# Patient Record
Sex: Female | Born: 1935 | Race: White | Hispanic: No | Smoking: Former smoker
Health system: Southern US, Community
[De-identification: ages and names within clinical notes are randomized; demographics above are authoritative.]

## PROBLEM LIST (undated history)

## (undated) DIAGNOSIS — I4891 Unspecified atrial fibrillation: Secondary | ICD-10-CM

## (undated) DIAGNOSIS — I10 Essential (primary) hypertension: Secondary | ICD-10-CM

## (undated) DIAGNOSIS — R42 Dizziness and giddiness: Secondary | ICD-10-CM

## (undated) DIAGNOSIS — J449 Chronic obstructive pulmonary disease, unspecified: Secondary | ICD-10-CM

## (undated) DIAGNOSIS — I5032 Chronic diastolic (congestive) heart failure: Secondary | ICD-10-CM

## (undated) DIAGNOSIS — I639 Cerebral infarction, unspecified: Secondary | ICD-10-CM

## (undated) DIAGNOSIS — C50912 Malignant neoplasm of unspecified site of left female breast: Secondary | ICD-10-CM

## (undated) DIAGNOSIS — I272 Pulmonary hypertension, unspecified: Secondary | ICD-10-CM

## (undated) DIAGNOSIS — H5440 Blindness, one eye, unspecified eye: Secondary | ICD-10-CM

## (undated) DIAGNOSIS — I6529 Occlusion and stenosis of unspecified carotid artery: Secondary | ICD-10-CM

## (undated) DIAGNOSIS — S81802A Unspecified open wound, left lower leg, initial encounter: Secondary | ICD-10-CM

## (undated) HISTORY — DX: Malignant neoplasm of unspecified site of left female breast: C50.912

## (undated) HISTORY — DX: Dizziness and giddiness: R42

## (undated) HISTORY — DX: Unspecified open wound, left lower leg, initial encounter: S81.802A

## (undated) HISTORY — DX: Occlusion and stenosis of unspecified carotid artery: I65.29

## (undated) HISTORY — DX: Unspecified atrial fibrillation: I48.91

## (undated) HISTORY — DX: Essential (primary) hypertension: I10

## (undated) HISTORY — PX: APPENDECTOMY: SHX54

## (undated) HISTORY — PX: OTHER SURGICAL HISTORY: SHX169

## (undated) HISTORY — PX: ABDOMINAL HYSTERECTOMY: SHX81

## (undated) HISTORY — DX: Blindness, one eye, unspecified eye: H54.40

## (undated) HISTORY — DX: Chronic diastolic (congestive) heart failure: I50.32

## (undated) HISTORY — DX: Pulmonary hypertension, unspecified: I27.20

## (undated) HISTORY — DX: Cerebral infarction, unspecified: I63.9

## (undated) HISTORY — DX: Chronic obstructive pulmonary disease, unspecified: J44.9

---

## 2011-03-23 DIAGNOSIS — I639 Cerebral infarction, unspecified: Secondary | ICD-10-CM

## 2011-03-23 DIAGNOSIS — I6529 Occlusion and stenosis of unspecified carotid artery: Secondary | ICD-10-CM

## 2011-03-23 HISTORY — DX: Cerebral infarction, unspecified: I63.9

## 2011-03-23 HISTORY — DX: Occlusion and stenosis of unspecified carotid artery: I65.29

## 2011-03-23 HISTORY — PX: CAROTID ENDARTERECTOMY: SUR193

## 2011-04-27 ENCOUNTER — Inpatient Hospital Stay (HOSPITAL_COMMUNITY)
Admission: AD | Admit: 2011-04-27 | Discharge: 2011-05-04 | DRG: 038 | Disposition: A | Discharge status: Home Health | Payer: Medicare Other | Source: Other Acute Inpatient Hospital | Attending: Internal Medicine | Admitting: Internal Medicine

## 2011-04-27 DIAGNOSIS — I10 Essential (primary) hypertension: Secondary | ICD-10-CM | POA: Diagnosis present

## 2011-04-27 DIAGNOSIS — D649 Anemia, unspecified: Secondary | ICD-10-CM | POA: Diagnosis present

## 2011-04-27 DIAGNOSIS — Z853 Personal history of malignant neoplasm of breast: Secondary | ICD-10-CM

## 2011-04-27 DIAGNOSIS — Z7982 Long term (current) use of aspirin: Secondary | ICD-10-CM

## 2011-04-27 DIAGNOSIS — I63239 Cerebral infarction due to unspecified occlusion or stenosis of unspecified carotid arteries: Principal | ICD-10-CM | POA: Diagnosis present

## 2011-04-27 DIAGNOSIS — E785 Hyperlipidemia, unspecified: Secondary | ICD-10-CM | POA: Diagnosis present

## 2011-04-27 DIAGNOSIS — I4891 Unspecified atrial fibrillation: Secondary | ICD-10-CM | POA: Diagnosis present

## 2011-04-27 DIAGNOSIS — B372 Candidiasis of skin and nail: Secondary | ICD-10-CM | POA: Diagnosis not present

## 2011-04-27 DIAGNOSIS — Z23 Encounter for immunization: Secondary | ICD-10-CM

## 2011-04-27 DIAGNOSIS — L02419 Cutaneous abscess of limb, unspecified: Secondary | ICD-10-CM | POA: Diagnosis present

## 2011-04-27 DIAGNOSIS — Z9089 Acquired absence of other organs: Secondary | ICD-10-CM

## 2011-04-27 DIAGNOSIS — Z7901 Long term (current) use of anticoagulants: Secondary | ICD-10-CM

## 2011-04-27 DIAGNOSIS — H5440 Blindness, one eye, unspecified eye: Secondary | ICD-10-CM | POA: Diagnosis present

## 2011-04-28 ENCOUNTER — Other Ambulatory Visit (HOSPITAL_COMMUNITY): Payer: Medicare Other

## 2011-04-28 ENCOUNTER — Inpatient Hospital Stay (HOSPITAL_COMMUNITY): Payer: Medicare Other

## 2011-04-28 LAB — BASIC METABOLIC PANEL
BUN: 23 mg/dL (ref 6–23)
CO2: 30 mEq/L (ref 19–32)
Chloride: 99 mEq/L (ref 96–112)
GFR calc Af Amer: >60 mL/min (ref >60)
Glucose, Bld: 135 mg/dL — ABNORMAL HIGH (ref 70–99)
Potassium: 3.9 mEq/L (ref 3.5–5.1)
Potassium: 3.7 mEq/L (ref 3.5–5.1)
Sodium: 137 mEq/L (ref 135–145)
Sodium: 138 mEq/L (ref 135–145)

## 2011-04-28 LAB — DIFFERENTIAL
Basophils Absolute: 0 10*3/uL (ref 0.0–0.1)
Eosinophils Absolute: 0.6 10*3/uL (ref 0.0–0.7)
Lymphocytes Relative: 19 % (ref 12–46)
Lymphocytes Relative: 20 % (ref 12–46)
Lymphs Abs: 1.6 10*3/uL (ref 0.7–4.0)
Neutro Abs: 5.4 10*3/uL (ref 1.7–7.7)
Neutrophils Relative %: 65 % (ref 43–77)
Neutrophils Relative %: 63 % (ref 43–77)

## 2011-04-28 LAB — CBC
HCT: 38.7 % (ref 36.0–46.0)
Hemoglobin: 12.6 g/dL (ref 12.0–15.0)
MCV: 87.5 fL (ref 78.0–100.0)
Platelets: 175 10*3/uL (ref 150–400)
RBC: 4.4 MIL/uL (ref 3.87–5.11)
RBC: 4.43 MIL/uL (ref 3.87–5.11)
WBC: 8.1 10*3/uL (ref 4.0–10.5)
WBC: 8.6 10*3/uL (ref 4.0–10.5)

## 2011-04-28 LAB — HEPARIN LEVEL (UNFRACTIONATED): Heparin Unfractionated: 0.97 IU/mL — ABNORMAL HIGH (ref 0.30–0.70)

## 2011-04-28 LAB — GLUCOSE, CAPILLARY: Glucose-Capillary: 162 mg/dL — ABNORMAL HIGH (ref 70–99)

## 2011-04-28 MED ORDER — GADOBENATE DIMEGLUMINE 529 MG/ML IV SOLN: 20.0000 mL | Freq: Once | INTRAVENOUS | Status: AC | PRN

## 2011-04-29 ENCOUNTER — Inpatient Hospital Stay (HOSPITAL_COMMUNITY): Payer: Medicare Other

## 2011-04-29 LAB — HEPARIN LEVEL (UNFRACTIONATED)
Heparin Unfractionated: 0.13 IU/mL — ABNORMAL LOW (ref 0.30–0.70)
Heparin Unfractionated: 0.17 IU/mL — ABNORMAL LOW (ref 0.30–0.70)

## 2011-04-29 LAB — CBC
HCT: 37.4 % (ref 36.0–46.0)
Hemoglobin: 12.2 g/dL (ref 12.0–15.0)
RDW: 13.9 % (ref 11.5–15.5)
WBC: 9 10*3/uL (ref 4.0–10.5)

## 2011-04-30 ENCOUNTER — Inpatient Hospital Stay (HOSPITAL_COMMUNITY): Payer: Medicare Other

## 2011-04-30 DIAGNOSIS — I6529 Occlusion and stenosis of unspecified carotid artery: Secondary | ICD-10-CM

## 2011-04-30 LAB — CBC
HCT: 37.2 % (ref 36.0–46.0)
Hemoglobin: 11.9 g/dL — ABNORMAL LOW (ref 12.0–15.0)
MCHC: 32 g/dL (ref 30.0–36.0)
RBC: 4.22 MIL/uL (ref 3.87–5.11)

## 2011-04-30 LAB — DIFFERENTIAL
Basophils Absolute: 0 10*3/uL (ref 0.0–0.1)
Basophils Relative: 0 % (ref 0–1)
Lymphocytes Relative: 24 % (ref 12–46)
Monocytes Absolute: 0.7 10*3/uL (ref 0.1–1.0)
Monocytes Relative: 9 % (ref 3–12)
Neutro Abs: 4.1 10*3/uL (ref 1.7–7.7)
Neutrophils Relative %: 58 % (ref 43–77)

## 2011-04-30 LAB — HEPARIN LEVEL (UNFRACTIONATED): Heparin Unfractionated: 0.4 IU/mL (ref 0.30–0.70)

## 2011-04-30 LAB — LIPID PANEL
Cholesterol: 155 mg/dL (ref 0–200)
LDL Cholesterol: 76 mg/dL (ref 0–99)
Total CHOL/HDL Ratio: 3.3 RATIO
Triglycerides: 162 mg/dL — ABNORMAL HIGH (ref <150)
VLDL: 32 mg/dL (ref 0–40)

## 2011-04-30 LAB — HEMOGLOBIN A1C: Mean Plasma Glucose: 117 mg/dL — ABNORMAL HIGH (ref <117)

## 2011-05-01 ENCOUNTER — Other Ambulatory Visit: Payer: Self-pay | Admitting: Vascular Surgery

## 2011-05-01 DIAGNOSIS — I6529 Occlusion and stenosis of unspecified carotid artery: Secondary | ICD-10-CM

## 2011-05-01 DIAGNOSIS — G8190 Hemiplegia, unspecified affecting unspecified side: Secondary | ICD-10-CM

## 2011-05-01 LAB — BASIC METABOLIC PANEL
CO2: 28 mEq/L (ref 19–32)
Calcium: 9.1 mg/dL (ref 8.4–10.5)
GFR calc non Af Amer: 51 mL/min — ABNORMAL LOW (ref >60)
Glucose, Bld: 102 mg/dL — ABNORMAL HIGH (ref 70–99)
Potassium: 4 mEq/L (ref 3.5–5.1)
Sodium: 139 mEq/L (ref 135–145)

## 2011-05-01 LAB — TYPE AND SCREEN
ABO/RH(D): O POS
Antibody Screen: NEGATIVE

## 2011-05-01 LAB — DIFFERENTIAL
Basophils Absolute: 0 10*3/uL (ref 0.0–0.1)
Basophils Relative: 0 % (ref 0–1)
Eosinophils Absolute: 0.5 10*3/uL (ref 0.0–0.7)
Monocytes Relative: 10 % (ref 3–12)
Neutro Abs: 4.6 10*3/uL (ref 1.7–7.7)
Neutrophils Relative %: 64 % (ref 43–77)

## 2011-05-01 LAB — CBC
HCT: 34.4 % — ABNORMAL LOW (ref 36.0–46.0)
Hemoglobin: 11.6 g/dL — ABNORMAL LOW (ref 12.0–15.0)
MCH: 27.8 pg (ref 26.0–34.0)
MCHC: 32.6 g/dL (ref 30.0–36.0)
MCV: 86.6 fL (ref 78.0–100.0)
Platelets: 196 10*3/uL (ref 150–400)
Platelets: 175 10*3/uL (ref 150–400)
RBC: 4.17 MIL/uL (ref 3.87–5.11)
RDW: 14.1 % (ref 11.5–15.5)

## 2011-05-01 LAB — HEPARIN LEVEL (UNFRACTIONATED): Heparin Unfractionated: 0.41 IU/mL (ref 0.30–0.70)

## 2011-05-01 LAB — ABO/RH: ABO/RH(D): O POS

## 2011-05-02 LAB — DIFFERENTIAL
Basophils Absolute: 0 10*3/uL (ref 0.0–0.1)
Basophils Relative: 0 % (ref 0–1)
Eosinophils Absolute: 0.1 10*3/uL (ref 0.0–0.7)
Monocytes Absolute: 0.7 10*3/uL (ref 0.1–1.0)
Monocytes Relative: 9 % (ref 3–12)

## 2011-05-02 LAB — BASIC METABOLIC PANEL
Calcium: 8.9 mg/dL (ref 8.4–10.5)
GFR calc Af Amer: >60 mL/min (ref >60)
GFR calc non Af Amer: 57 mL/min — ABNORMAL LOW (ref >60)
Sodium: 137 mEq/L (ref 135–145)

## 2011-05-02 LAB — CBC
MCH: 27.9 pg (ref 26.0–34.0)
MCHC: 31.9 g/dL (ref 30.0–36.0)
Platelets: 157 10*3/uL (ref 150–400)
RDW: 14.3 % (ref 11.5–15.5)

## 2011-05-03 LAB — CBC
MCH: 28.1 pg (ref 26.0–34.0)
Platelets: 156 10*3/uL (ref 150–400)
RBC: 3.99 MIL/uL (ref 3.87–5.11)
WBC: 8.6 10*3/uL (ref 4.0–10.5)

## 2011-05-03 LAB — PROTIME-INR
INR: 1.02 (ref 0.00–1.49)
Prothrombin Time: 13.6 seconds (ref 11.6–15.2)

## 2011-05-04 ENCOUNTER — Telehealth: Payer: Self-pay | Admitting: *Deleted

## 2011-05-04 ENCOUNTER — Telehealth: Payer: Self-pay | Admitting: Cardiovascular Disease

## 2011-05-04 LAB — CBC
MCH: 27.9 pg (ref 26.0–34.0)
MCV: 87.9 fL (ref 78.0–100.0)
Platelets: 163 10*3/uL (ref 150–400)
RDW: 14 % (ref 11.5–15.5)
WBC: 8.2 10*3/uL (ref 4.0–10.5)

## 2011-05-04 LAB — DIFFERENTIAL
Basophils Relative: 0 % (ref 0–1)
Eosinophils Absolute: 0.5 10*3/uL (ref 0.0–0.7)
Eosinophils Relative: 7 % — ABNORMAL HIGH (ref 0–5)
Lymphs Abs: 1.7 10*3/uL (ref 0.7–4.0)

## 2011-05-04 LAB — PROTIME-INR
INR: 1.06 (ref 0.00–1.49)
Prothrombin Time: 14 seconds (ref 11.6–15.2)

## 2011-05-04 NOTE — Telephone Encounter (Signed)
Per reply dr arida--pt does not need to take amlodipine for now--will dicuss at first post hosp o.v.--this information given to son--nt

## 2011-05-04 NOTE — Telephone Encounter (Signed)
No need to take Amlodipine for now. Will decide if we need to resume it when she comes for follow up.

## 2011-05-04 NOTE — Telephone Encounter (Signed)
Pt's son calling stating dr fields(vascular ) had his mother in hosp for endarterectomy and when d/c dr fields did not list amlodipine on med sheet for post op med--pt has been taking this, according to son, for long time--could not find any notes about discharge in e-chart--advised pt to phone dr fields--pt states mother has appoint in our [place] office, but not for 2 weeks, and i don't see any notes from [place] in EPIC--will notify dr Kirke Corin for some help--nt

## 2011-05-04 NOTE — Telephone Encounter (Signed)
Per pt son call, pt just got out of hospital and was given a list of medications pt can and cannot take. One of the medications pt takes was not on the list, pt would like to know is she can take this medication  Amilodipine 10 mg  Please call back to advise.

## 2011-05-06 NOTE — Discharge Summary (Signed)
NAMEMarland Kitchen  Karina, Salazar NO.:  [id number]  MEDICAL RECORD NO.:  [id number]  LOCATION:  2039                         FACILITY:  [facility]  PHYSICIAN:  Lonia Blood, M.D.       DATE OF BIRTH:  12-24-1923  DATE OF ADMISSION:  04/27/2011 DATE OF DISCHARGE:  05/04/2011                              DISCHARGE SUMMARY   PRIMARY CARE PHYSICIAN:  Dr. Loney Hering in [place], [place].  DISCHARGE DIAGNOSES: 1. Symptomatic right internal carotid artery stenosis with a right     frontal corona radiata infarct. 2. New diagnosis of atrial fibrillation - felt to be asymptomatic. 3. Chronic occlusion of the left internal carotid artery. 4. Hypertension. 5. Hyperlipidemia. 6. Status post splenectomy. 7. Status post hysterectomy.  DISCHARGE MEDICATIONS: 1. Aspirin 81 mg daily. 2. Atenolol 50 mg daily. 3. Calcium a tablet daily. 4. Coumadin 2 mg daily. 5. Lisinopril/hydrochlorothiazide 20/12.5 daily. 6. Multivitamin daily. 7. Sulfadiazine applied to the right neck twice a day.  CONDITION ON DISCHARGE:  Karina Salazar was discharged in good condition, alert, oriented, no acute distress.  Neurologically intact.  She will follow up at the [facility] on May 08, 2011 for PT/INR check.  She will also follow up with her primary care physician as needed and with Dr. Fabienne Bruns from vascular vein specialist for the post carotid endarterectomy followup.  CONSULTATION THIS ADMISSION:  The patient was seen by the [facility] Cardiology Service, Neurology Service and Dr. Fabienne Bruns from Vascular Surgery.  HISTORY AND PHYSICAL:  Refer to dictated H and P done by Dr. Jomarie Longs.  HOSPITAL COURSE:  Karina Salazar is an 87-year woman with multiple medical problems who was transferred from [facility] with an acute stroke.  Initially, there was a debate between amongst the medical team, but her stroke was due to atrial fibrillation versus the right internal carotid stenosis.  After much  deliberation, Dr. Delia Heady, a stroke specialist, Director of the Stroke Center, felt that the stroke was due to symptomatic right ICA stenosis.  The patient at that point in time was getting treated with intravenous heparin.  This was felt to be unnecessary after this designation and the patient was switched to antiplatelet therapy alone.  She then underwent right internal carotid endarterectomy with Dr. Fabienne Bruns on May 01, 2011. Postoperatively again the team of the cardiologist with  Group Dr. Riley Kill, the hospital is Dr. Lavera Guise and neurologist Dr. Pearlean Brownie, conferred and consensus was not to do any heparin bridging due to the high risk of forming a right neck hematoma in somebody with the fresh carotid endarterectomy.  The patient was started on Coumadin alone.  She was continued on aspirin and she will follow up at the [facility].  Post her stroke, the patient was seen by Physical Therapy, Occupational Therapy, and she is neurologically intact, able to return home with the family without any other therapy needs.  During this admission, Karina Salazar was observed on telemetry and it was noted that her atrial fibrillation was very well rate controlled.  The patient also having immunoglobin, it was measured 5.7 which indicated that she does not have diabetes mellitus type  2.  Otherwise, the patient's lipid profile indicated an LDL of 76 which is well below the goal of 100. Otherwise Karina Salazar was found to be hemodynamically stable, neurologically intact and on May 04 2011, she was discharged home to discharge summary.     Lonia Blood, M.D.     SL/MEDQ  D:  05/05/2011  T:  05/05/2011  Job:  [id number]  Electronically Signed by Lonia Blood M.D. on 05/06/2011 07:58:28 AM

## 2011-05-07 NOTE — Consult Note (Signed)
NAMEMarland Kitchen  Karina Salazar, Karina Salazar NO.:  [id number]  MEDICAL RECORD NO.:  [id number]  LOCATION:  3715                         FACILITY:  [facility]  PHYSICIAN:  Janetta Hora. Fields, MD  DATE OF BIRTH:  1924/05/17  DATE OF CONSULTATION:  04/30/2011 DATE OF DISCHARGE:                                CONSULTATION   REFERRING PHYSICIAN:  Pramod P. Pearlean Brownie, MD  REASON FOR CONSULTATION:  Symptomatic right internal carotid artery stenosis.  HISTORY OF PRESENT ILLNESS:  The patient is an 77 year old female transferred from [facility] after an episode of left-handed weakness, numbness, and slurring of speech.  This occurred on April 25, 2011.  Evaluation with a carotid duplex scan and CT scan of the head as well as an MRA of the neck showed a left internal carotid artery occlusion with a high-grade right internal carotid artery stenosis and she has subacute infarct of her right frontoparietal area of her brain.  The patient has essentially returned to baseline neurologic function at this point.  She was transferred to [facility] for further management. She is currently on a heparin drip.  She has had no new neurologic events.  CHRONIC MEDICAL PROBLEMS:  Hypertension and a left leg chronic ulcer, these problems are currently stable, atrial fibrillation as well.  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  FAMILY HISTORY:  No significant history of early vascular disease.  SOCIAL HISTORY:  The patient lives at home alone and has family to check in on her frequently.  She denies tobacco, alcohol, or illicit drug use.  REVIEW OF SYSTEMS:  Full 12-point review of systems was performed with the patient and reviewed from previous history and physical on March 28, 2011, as well as neurologic consultation on March 28, 2011, and this was all negative on all  points.  MEDICATIONS: 1. Aspirin 81 mg once a day. 2. Atenolol 50 mg once a day. 3. Diflucan 100 mg once a day. 4. Heparin drip. 5. Lisinopril 10 mg once a day. 6. Silvadene to left calf wound b.i.d. 7. Bactrim 1 tablet twice a day. 8. Tylenol and Zofran p.r.n.  ALLERGIES:  She is allergic to CIPRO.  PHYSICAL EXAMINATION:  VITAL SIGNS:  Temperature 99.4, pulse 101 and irregular, respirations 20, blood pressure 113/71, 96% saturation on room air. HEENT:  Unremarkable except for right eye blindness and the pupil was nonreactive. NECK:  2+ carotid pulses.  No bruit. CHEST:  Clear to auscultation. CARDIAC:  Irregularly irregular.  No murmur. ABDOMEN:  Obese, soft, nontender, nondistended.  No mass. EXTREMITIES:  She has no palpable pedal pulses bilaterally.  She has a 3 cm less than a mm in depth ulceration on the left pretibial area.  Feet are pink, warm, and well perfused.  MRA of the head was reviewed as well as MRA of the neck.  This again confirms occlusion of the left internal carotid artery with some intracranial disease.  She has a type 3 aortic arch.  She has a high- grade greater than 80% right internal carotid artery stenosis with reconstitution distally.  MRI of  the head shows subacute right frontal corona radiata infarct as well as watershed infarcts in the anterior cerebral and middle cerebral territories.  She also has remote bilateral basal ganglia lacunes.  LABORATORY:  Hemoglobin A1c 5.7, triglyceride 162, cholesterol 155. White blood cell count 7.1, hemoglobin 11.9, platelets 193, BUN 23, creatinine 0.9.  ASSESSMENT:  An 77 year old female with recent right brain infarct and symptomatic right internal carotid artery stenosis.  The patient has reached a neurologic plateau and is back to her baseline.  I had a lengthy discussion with the patient and her family today regarding options of medical therapy versus carotid stenting versus carotid endarterectomy.   Currently, the risk of carotid stenting in a patient greater than 45 years old is fairly significant and certainly more than carotid endarterectomy.  Especially in light of the fact that the patient has a type 3 aortic arch, it would be anatomically difficult to perform a carotid stent.  I quoted her risk of carotid stenting of stroke between 5 and 10%.  Risk of stroke with carotid endarterectomy 1- 2%.  Risk of stroke with medical therapy between 10 and 20%.  Other risk, benefits, and possible complications of carotid endarterectomy were also discussed with the patient and her family today.  At this point, she is agreeable for right carotid endarterectomy.  Her procedure was scheduled for tomorrow.  She will be n.p.o. after midnight tonight. We will stop her heparin at 5 a.m.  She will have her aspirin with a small sip of water in the morning.     Janetta Hora. Fields, MD     CEF/MEDQ  D:  04/30/2011  T:  05/01/2011  Job:  [id number]  cc:   Clyda Greener, MD Pramod P. Pearlean Brownie, MD  Electronically Signed by Fabienne Bruns MD on 05/07/2011 09:54:06 AM

## 2011-05-07 NOTE — Op Note (Signed)
NAMEMarland Salazar  ELESE, RANE NO.:  [id number]  MEDICAL RECORD NO.:  [id number]  LOCATION:  2039                         FACILITY:  [facility]  PHYSICIAN:  Janetta Hora. Fields, MD  DATE OF BIRTH:  02/20/1924  DATE OF PROCEDURE:  05/01/2011 DATE OF DISCHARGE:                              OPERATIVE REPORT   PROCEDURE:  Right carotid endarterectomy.  PREOPERATIVE DIAGNOSIS:  Symptomatic right internal carotid artery stenosis.  POSTOPERATIVE DIAGNOSIS:  Symptomatic right internal carotid artery stenosis.  ANESTHESIA:  General.  SURGEON:  Charles E. Fields, MD  ASSISTANTS: 1. Di Kindle. Edilia Bo, M.D. 2. Newton Pigg, PA-C  OPERATIVE FINDINGS: 1. Greater than 80% stenosis, right internal carotid artery. 2. 10-French shunt. 3. Dacron patch.  OPERATIVE DETAILS:  After obtaining informed consent, the patient was taken to the operating room.  The patient was placed in supine position on the operating table.  After induction of general anesthesia and endotracheal intubation, the patient's entire right neck and chest were prepped and draped in the usual sterile fashion.  Next, an oblique incision was made on the right side of the neck just anterior to the border of the right sternocleidomastoid muscle.  Incision was carried down through the subcutaneous tissues and through the platysma muscle. The sternocleidomastoid muscle was identified and reflected laterally. The common facial vein was identified, dissected free circumferentially, and ligated and divided between silk ties.  The ansa cervicalis was identified and this was traced up to its origin of the hypoglossal nerve.  The hypoglossal nerve was well up out of the operative field and protected at all times during the operation.  Common carotid artery was dissected free circumferentially at the base of the neck.  To allow further exposure, the omohyoid muscle was completely divided with cautery.  The patient  had a fairly short neck and I was able to expose a moderate amount of common carotid artery to facilitate proximal clamping site.  Common carotid artery was soft in character.  The vagus nerve was identified and protected.  Dissection was carried up to the level of the superior thyroid artery and this was dissected free circumferentially and vessel loop was placed around this.  There was firm plaque at the carotid bifurcation.  The external carotid artery was dissected free circumferentially and vessel loop was placed around this.  The distal internal carotid artery was then dissected free circumferentially at a place of that was palpable and seemed to be free of disease distally on the internal carotid artery.  The patient was given 10,000 units of intravenous heparin.  The distal internal carotid arteries were controlled with a fine bulldog clamp.  The external carotid and superior thyroid arteries were controlled with vessel loops.  Common carotid artery was controlled with peripheral DeBakey clamp.  Longitudinal arteriotomy was then made using #11 blade to expose the carotid bifurcation.  The arteriotomy was then extended distally into the internal carotid artery past the level of the disease.  There was firm, rubbery plaque with a recent hemorrhage within the plaque at the level of the stenosis.  The stenosis was approximately 80%.  A 10-French shunt was then brought up in the operative  field and threaded into the distal internal carotid artery and allowed to backbleed thoroughly.  There was reasonable backbleeding from this.  This was then threaded down the common carotid artery and secured with a Rumel tourniquet.  Shunt was inspected and found to be free of air, and flow was restored to the brain after approximately 5 minutes of ischemia time.  Next, endarterectomy was begun in a suitable plane near the carotid bifurcation.  A good distal endpoint was obtained.  There was  minimal disease in the common carotid artery and a nice feathered proximal endpoint was also obtained.  The external carotid artery was endarterectomized in an eversion technique.  All loose debris was removed from the carotid bed.  Dacron patch was then brought up in the operative field and sewn as a patch angioplasty using a running 6-0 Prolene suture.  Just prior to completion of anastomosis, the shunt was re-occluded with a hemostat.  Shunt was then brought down out of the distal internal carotid artery and allowed to backbleed thoroughly. This was then secured with a fine bulldog clamp.  Shunt was then removed from the proximal common carotid artery and this was again controlled with peripheral DeBakey clamp.  Everything was forebled, backbled, and thoroughly flushed.  Heparinized saline was used to thoroughly irrigate the artery.  The remainder of the patch was then completed.  Just prior to completion anastomosis, the external carotid artery was thoroughly backbled.  Flow was then first restored from common carotid and external carotid artery and after approximately 5 cardiac cycles to the internal carotid artery.  Doppler was used to evaluate the internal, external, and common carotid arteries and these all had good flow.  Hemostasis was obtained with the administration of protamine as well as thrombin and Gelfoam.  After hemostasis had been obtained, the platysma muscles were reapproximated using running 3-0 Vicryl suture.  Skin was closed with 4- 0 Vicryl subcuticular stitch.  Dermabond was applied on the incision. The patient tolerated the procedure well and there were no complications.  Instrument, sponge, and needle counts were correct at the end of the case.  The patient was awakened in the operating room and following commands and moving upper and lower extremities symmetrically on departure from the operating room.     Janetta Hora. Fields, MD     CEF/MEDQ  D:   05/02/2011  T:  05/03/2011  Job:  [id number]  cc:   Pramod P. Pearlean Brownie, MD Clyda Greener, MD  Electronically Signed by Fabienne Bruns MD on 05/07/2011 09:54:15 AM

## 2011-05-08 ENCOUNTER — Ambulatory Visit (INDEPENDENT_AMBULATORY_CARE_PROVIDER_SITE_OTHER): Payer: Medicare Other | Admitting: *Deleted

## 2011-05-08 DIAGNOSIS — Z7901 Long term (current) use of anticoagulants: Secondary | ICD-10-CM | POA: Insufficient documentation

## 2011-05-08 DIAGNOSIS — I4891 Unspecified atrial fibrillation: Secondary | ICD-10-CM | POA: Insufficient documentation

## 2011-05-08 DIAGNOSIS — I6350 Cerebral infarction due to unspecified occlusion or stenosis of unspecified cerebral artery: Secondary | ICD-10-CM | POA: Insufficient documentation

## 2011-05-09 NOTE — Consult Note (Signed)
NAMEMarland Salazar  DANAJA, LASOTA NO.:  [id number]  MEDICAL RECORD NO.:  [id number]  LOCATION:  3715                         FACILITY:  [facility]  PHYSICIAN:  Joycelyn Schmid, MD   DATE OF BIRTH:  1924-08-22  DATE OF CONSULTATION:  04/27/2011 DATE OF DISCHARGE:                                CONSULTATION   TIME:  9:30 p.m.  CHIEF COMPLAINT:  Dizziness, TIA, right ICA stenosis, left ICA occlusion.  HISTORY OF PRESENT ILLNESS:  This 77 year old right-handed female with history of hypertension, breast cancer, now with new-onset atrial fibrillation, was in normal state of health until April 25, 2011, when she was at home, sitting on the sofa began to feel lightheaded and dizzy. She tried to stand up and fell backwards.  She landed back onto the couch and noticed that the left arm rotated internally.  There were some reports per the family that her speech was slurred.  At some point, her symptoms resolved although the patient is not sure when they stopped. Presently, the patient does not have the symptoms which she presented with.  She was admitted to [facility] for further evaluation. There, she was diagnosed with atrial fibrillation and started on heparin drip.  She was also diagnosed with left ICA occlusion and right ICA stenosis.  She was then transferred to [facility] for further evaluation.  PAST MEDICAL HISTORY: 1. Hypertension. 2. Left leg wound. 3. Left breast cancer. 4. Right eye blindness secondary to trauma.  PAST SURGICAL HISTORY: 1. Left mastectomy for breast cancer. 2. Bowel resection in the past. 3. Left cataract surgery. 4. Hysterectomy. 5. Appendectomy.  FAMILY HISTORY:  None.  SOCIAL HISTORY:  She lives at home, her nephew lives with her.  No tobacco, alcohol, or illicit drug use.  REVIEW OF SYSTEMS:  As per the HPI, she denies chest pain, shortness of breath, nausea or vomiting, blurred vision, slurred speech, numbness  or weakness at this time.  Full 14-system review is otherwise negative.  HOME MEDICATIONS: 1. Atenolol 50 mg daily. 2. Lisinopril/hydrochlorothiazide 20/12.5 tablets one daily. 3. Guaifenesin 600 mg t.i.d. 4. Aspirin 325 mg twice a day. 5. Calcium 1 tablet per day. 6. Multivitamin one tablet per day.  ALLERGIES:  CIPROFLOXACIN and FLUOROQUINOLONES.  PHYSICAL EXAMINATION:  VITAL SIGNS:  Blood pressure 189/51, temperature 98.3, heart rate 86 and regular, respirations 18, O2 sat 100% on 2 liters nasal cannula. GENERAL:  She is awake and alert.  Language is fluent.  Comprehension is intact.  She has some psychomotor slowing.  She did not know what the month is.  She states the year is 2012 and states her age is 77 years old. NEUROLOGIC:  Cranial Nerve:  Right eye posttraumatic and no reaction to light, no light perception.  Left eye, reactive from 2-1 mm, left visual fields in left eye are full to confrontation.  Extraocular muscles intact.  Facial sensation and strength are symmetric.  Uvula is midline. Shoulders are symmetric.  Tongue is midline.  Motor examination normal bulk and tone.  No drift in the upper or lower extremities, 5/5 strength in the bilateral upper extremities, 4/5 proximally in the lower extremities, 5/5 distally  in the lower extremities and symmetric. Sensory examination marked decreased pinprick and light touch sensation in the bilateral lower extremities.  She has absent vibration at the knees and ankles and toes. Reflexes:  Trace in the upper extremities and absent at the knees and ankles with downgoing toes. Coordination Testing:  Finger-nose-finger smooth and symmetric. CARDIOVASCULAR:  Regular rate and rhythm.  No murmurs.  No carotid bruits.  ASSESSMENT:  This is an 77 year old female with new-onset atrial fibrillation, now with suspected right brain transient ischemic attack in the setting of the left internal carotid artery occlusion and  right internal carotid artery near occlusive stenosis.  RECOMMENDATIONS: 1. Agree with anticoagulation with heparin drip. 2. MRI of the brain, MRA of the head and neck. 3. We would avoid hypotension, goal systolic blood pressure 120-180. 4. After additional neuroimaging studies are complete, we would     consider Vascular Surgery consult.  At this time, the patient is     not sure if she want to undergo surgery; however, I think we should     consider the possibility of carotid endarterectomy if she would     want to go through it and if she could tolerate the surgery from a     cardiac standpoint.  Otherwise, there may be a need to be a     consideration for carotid stent placement.  We will discuss with     Vascular Surgery for further recommendations.     Joycelyn Schmid, MD     VP/MEDQ  D:  04/27/2011  T:  04/27/2011  Job:  [id number]  Electronically Signed by Joycelyn Schmid  on 05/09/2011 10:23:01 PM

## 2011-05-15 ENCOUNTER — Ambulatory Visit (INDEPENDENT_AMBULATORY_CARE_PROVIDER_SITE_OTHER): Payer: Self-pay | Admitting: *Deleted

## 2011-05-15 DIAGNOSIS — I6350 Cerebral infarction due to unspecified occlusion or stenosis of unspecified cerebral artery: Secondary | ICD-10-CM

## 2011-05-15 DIAGNOSIS — Z7901 Long term (current) use of anticoagulants: Secondary | ICD-10-CM

## 2011-05-15 DIAGNOSIS — R0989 Other specified symptoms and signs involving the circulatory and respiratory systems: Secondary | ICD-10-CM

## 2011-05-15 DIAGNOSIS — I4891 Unspecified atrial fibrillation: Secondary | ICD-10-CM

## 2011-05-22 ENCOUNTER — Ambulatory Visit (INDEPENDENT_AMBULATORY_CARE_PROVIDER_SITE_OTHER): Payer: Self-pay | Admitting: *Deleted

## 2011-05-22 DIAGNOSIS — I6350 Cerebral infarction due to unspecified occlusion or stenosis of unspecified cerebral artery: Secondary | ICD-10-CM

## 2011-05-22 DIAGNOSIS — I4891 Unspecified atrial fibrillation: Secondary | ICD-10-CM

## 2011-05-22 DIAGNOSIS — Z7901 Long term (current) use of anticoagulants: Secondary | ICD-10-CM

## 2011-05-22 DIAGNOSIS — R0989 Other specified symptoms and signs involving the circulatory and respiratory systems: Secondary | ICD-10-CM

## 2011-05-22 LAB — POCT INR: INR: 1.9

## 2011-05-22 MED ORDER — WARFARIN SODIUM 2 MG PO TABS: ORAL_TABLET | ORAL | Status: DC

## 2011-05-24 ENCOUNTER — Ambulatory Visit: Payer: Medicare Other | Admitting: Vascular Surgery

## 2011-05-24 NOTE — H&P (Signed)
NAME:  Karina Salazar, Karina Salazar NO.:  [id number]  MEDICAL RECORD NO.:  [id number]  LOCATION:  3715                         FACILITY:  [facility]  PHYSICIAN:  Zannie Cove, MD     DATE OF BIRTH:  Mar 26, 1924  DATE OF ADMISSION:  04/27/2011 DATE OF DISCHARGE:                             HISTORY & PHYSICAL   CHIEF COMPLAINT:  Transfer from [facility] for further evaluation.  HISTORY OF PRESENTING ILLNESS:  Karina Salazar is an 77 year old white female with history of hypertension, breast cancer.  Presented to [facility] on April 25, 2011, with chief complaint of dizziness, left hand weakness and numbness and slurring of speech.  On evaluation over there, she was found to be in new onset AFib, subsequently seen by [facility] and as part of her workup, she had a CT scan done which did not show any acute abnormality, but CT report read remote left posterior frontal cortical infarct.  In addition, she also had a parotid Duplex done at [facility] yesterday which showed left ICA origin occlusion and high-grade new occlusive proximal right ICA stenosis and subsequently transferred to [facility] for further management, possible vascular evaluation.  PAST MEDICAL HISTORY:  Significant for, 1. Remote history of breast cancer status post left mastectomy. 2. History of resection of her bowel many years ago. 3. Left cataract surgery. 4. Hysterectomy. 5. Appendectomy. 6. Right leg wound sustained about 2-4 weeks ago while doing yard     work.  FAMILY HISTORY:  Due to advanced age, noncontributory.  HOME MEDICATIONS: 1. Atenolol 50 mg daily. 2. Lisinopril/hydrochlorothiazide 20/12.5 one tab daily. 3. Guaifenesin 600 mg t.i.d. 4. Aspirin 325 daily. 5. Calcium 1 tab daily. 6. Multivitamin 1 tablet daily.  ALLERGIES: 1. CIPROFLOXACIN. 2. FLUOROQUINOLONE.  SOCIAL HISTORY:  She lives at home by herself, is independent in ADLs. Ambulates with help of a cane.   Denies any history of alcohol or tobacco use.  REVIEW OF SYSTEMS:  Negative except per HPI.  PHYSICAL EXAMINATION:  VITAL SIGNS:  Currently temp is 97.9, pulse 60, blood pressure 142/65, respirations 18, and satting 100% on 2 L. GENERAL:  This is a elderly Caucasian female lying in the stretcher in no acute distress. HEENT:  Pupils are 4 mm reactive to light.  Extraocular movements intact.  Oral mucosa is moist. NECK:  No JVD or lymphadenopathy. CARDIOVASCULAR:  S1, S2 irregularly irregular rhythm. LUNGS:  Clear to auscultation bilaterally. CHEST:  On the left chest, there is a large area of erythema likely related to irritation. ABDOMEN:  Soft, obese, nontender with normal bowel sounds. EXTREMITIES:  Her right lower extremity has erythema, 1+ edema, multiple small skin tears, and superficial ulcers with overlying erythema.  LABORATORY DATA:  Review of laboratory data from [facility] on April 25, 2011, shows a white count of 7.2, hemoglobin 11.7, and platelets 163.  BMET; sodium of 140, potassium 4.2, chloride 102, bicarb 29, BUN 34, creatinine 1.4.  PT is 13, INR 1.0, CK is 45, CK-MB is 1.9, relative index 4.2, and troponin I is 0.02.  EKG again from [facility] head shows atrial fibrillation without acute ST-T wave changes.  Report of diagnostic studies from  [facility] include 2-D echocardiogram showed LV cavity size normal, mild concentric ventricular hypertrophy, ejection fraction is 65%, septal flattening, left atrium moderately dilated, and RV mildly dilated.  Mild to moderate tricuspid regurg, moderate pulmonary hypertension.  No pericardial effusion. Carotid duplex also done in [facility] report shows left internal carotid artery origin occlusion, high-grade knee occlusive proximal right ICA stenosis, antegrade flow in both vertebral arteries.  ASSESSMENT/PLAN:  Karina Salazar is an 77 year old female with, 1. New onset atrial fibrillation, rate is controlled.   Keep her on     telemetry.  Continue atenolol at home dose.  In terms of     anticoagulating her, I would recommend continuing heparin with     possibly starting Coumadin for long time anticoagulation.  Her [place]     score is significantly high given that her CT scan does show     evidence of previous stroke, however this is not without risk due     to the fact that she has issues with falls as well as decreased     vision in one of her eyes.  However, we will consult Neurology to     assist with further evaluation and management for her ICA internal     carotid artery disease.  We will also check an MRI and MRA of her     brain and neck. 2. We will notify [facility] about the patient's transfer to     [facility] today.  In terms of vascular surgical evaluation, we     will await neurological opinion regarding this.  I doubt she would     be a candidate for carotid endarterectomy, however we will defer     this expert opinion to the neurologist.  Further management as     condition evolves.     Zannie Cove, MD     PJ/MEDQ  D:  04/27/2011  T:  04/27/2011  Job:  [id number]  cc:   Karina Greener, MD  Electronically Signed by Zannie Cove  on 05/24/2011 05:10:25 PM

## 2011-05-25 ENCOUNTER — Ambulatory Visit (INDEPENDENT_AMBULATORY_CARE_PROVIDER_SITE_OTHER): Payer: Medicare Other | Admitting: Cardiovascular Disease

## 2011-05-25 ENCOUNTER — Encounter: Payer: Self-pay | Admitting: *Deleted

## 2011-05-25 VITALS — BP 144/83 | HR 90 | Ht 60.0 in | Wt 193.0 lb

## 2011-05-25 DIAGNOSIS — I4891 Unspecified atrial fibrillation: Secondary | ICD-10-CM

## 2011-05-25 DIAGNOSIS — I10 Essential (primary) hypertension: Secondary | ICD-10-CM

## 2011-05-25 DIAGNOSIS — I6529 Occlusion and stenosis of unspecified carotid artery: Secondary | ICD-10-CM

## 2011-05-25 NOTE — Progress Notes (Signed)
HPI  This is an 77 year old female who is here today for a followup visit. I saw her in June at [facility]. She presented at that time with a stroke and was found to have atrial fibrillation which was new. He her carotid ultrasound Doppler showed an occluded left ICA and near occlusive disease in the right ICA. The patient was transferred to [facility] where she was evaluated by neurology and vascular surgery. She ultimately underwent right carotid endarterectomy which was done by Dr. Darrick Penna. She was followed by our cardiology team there. Her atrial fibrillation rate was controlled with atenolol. She was started on anticoagulation with warfarin after the surgery. She has been doing reasonably well. She is currently getting physical therapy. She denies any chest pain or dyspnea. There has been no palpitations, dizziness or syncope.  Allergies  Allergen Reactions  . Ciprofloxacin   . Other     FLUOROQUINOLONE     Current Outpatient Prescriptions on File Prior to Visit  Medication Sig Dispense Refill  . warfarin (COUMADIN) 2 MG tablet Take 2 tablets daily or as directed  60 tablet  3     Past Medical History  Diagnosis Date  . Breast cancer, left breast     s/p left mastectomy  . Leg wound, left   . Blindness of right eye     sustained trauma to right eye  . Dizziness     occasional  . Atrial fibrillation   . Hypertension   . Carotid artery occlusion 03/2011    occluded left ICA. Near occlusive disease in R ICA. s/p CEA  . Stroke 03/2011     Past Surgical History  Procedure Date  . Left breast mastectomy     breast cancer  . Resection of bowel     many years ago,small bowel obstruction  . Abdominal hysterectomy   . Appendectomy   . Left eye cataract surgery   . Carotid endarterectomy 03/2011    right     Family History  Problem Relation Age of Onset  . Other      Negative for premature coronary artery disease or arrhythmia     History   Social  History  . Marital Status: Divorced    Spouse Name: N/A    Number of Children: N/A  . Years of Education: N/A   Occupational History  . Not on file.   Social History Main Topics  . Smoking status: Former Smoker -- 0.5 packs/day for 35 years    Types: Cigarettes    Quit date: 10/22/1976  . Smokeless tobacco: Never Used  . Alcohol Use: No  . Drug Use: No  . Sexually Active: Not on file   Other Topics Concern  . Not on file   Social History Narrative   Lives alone and independent in ADL's.Ambulates with help of cane       PHYSICAL EXAM   BP 144/83  Pulse 90  Ht 5' (1.524 m)  Wt 193 lb (87.544 kg)  BMI 37.69 kg/m2  Constitutional: She is oriented to person, place, and time. She appears well-developed and well-nourished. No distress.  HENT: No nasal discharge.  Head: Normocephalic and atraumatic.  Eyes: Pupils are equal, round, and reactive to light. Right eye exhibits no discharge. Left eye exhibits no discharge.  Neck: Normal range of motion. Neck supple. No JVD present. No thyromegaly present. There is a scar on the right side from right carotid endarterectomy which seems to be healing. Cardiovascular: Normal rate,  irregular rhythm, normal heart sounds. Exam reveals no gallop and no friction rub.  No murmur heard.  Pulmonary/Chest: Effort normal and breath sounds normal. No stridor. No respiratory distress. She has no wheezes. She has no rales. She exhibits no tenderness.  Abdominal: Soft. Bowel sounds are normal. She exhibits no distension. There is no tenderness. There is no rebound and no guarding.  Musculoskeletal: Normal range of motion. She exhibits trace edema and no tenderness.  Neurological: She is alert and oriented to person, place, and time. Coordination normal.  Skin: Skin is warm and dry. There is stasis dermatitis in the lower extremities. She is not diaphoretic. No erythema. No pallor.  Psychiatric: She has a normal mood and affect. Her behavior is  normal. Judgment and thought content normal.     EKG: Atrial fibrillation with a ventricular rate of 79 beats per minute. Left atrial enlargement.   ASSESSMENT AND PLAN

## 2011-05-25 NOTE — Assessment & Plan Note (Signed)
Her blood pressure is reasonably controlled. Continue current medications. I will keep her off amlodipine for now.

## 2011-05-25 NOTE — Assessment & Plan Note (Signed)
This was a recently diagnosed in June but is likely chronic at this time. She is not symptomatic. I recommend rate control and long-term anticoagulation with a goal INR between 2 and 3. Continue atenolol 50 mg once daily.

## 2011-05-25 NOTE — Assessment & Plan Note (Signed)
Status post right carotid endarterectomy. Continue aspirin 81 mg once daily.

## 2011-05-25 NOTE — Patient Instructions (Signed)
Your physician wants you to follow-up in: 4 months. You will receive a reminder letter in the mail one-two months in advance. If you don't receive a letter, please call our office to schedule the follow-up appointment. Your physician recommends that you continue on your current medications as directed. Please refer to the Current Medication list given to you today. 

## 2011-05-29 ENCOUNTER — Ambulatory Visit (INDEPENDENT_AMBULATORY_CARE_PROVIDER_SITE_OTHER): Payer: Self-pay | Admitting: Cardiology

## 2011-05-29 DIAGNOSIS — R0989 Other specified symptoms and signs involving the circulatory and respiratory systems: Secondary | ICD-10-CM

## 2011-06-01 ENCOUNTER — Ambulatory Visit (INDEPENDENT_AMBULATORY_CARE_PROVIDER_SITE_OTHER): Payer: Medicare Other | Admitting: Physician Assistant

## 2011-06-01 DIAGNOSIS — I6529 Occlusion and stenosis of unspecified carotid artery: Secondary | ICD-10-CM

## 2011-06-01 NOTE — Progress Notes (Addendum)
Subjective:     Patient ID: Karina Salazar, female   DOB: Aug 24, 1924, 77 y.o.   MRN: [id number]  HPIPt presents for f/u of right CEA 05/01/11.  She and her son state that she has been doing well and is without complaint.  She had a pre-operative CVA but has no residual deficits.  She also has afib and is taking coumadin. She denies TIA/CVA sx, CP, SOB, N/V, and claudication sx.  She has a slow gait and uses a walker.    Past Medical History  Diagnosis Date  . Breast cancer, left breast     s/p left mastectomy  . Leg wound, left   . Blindness of right eye     sustained trauma to right eye  . Dizziness     occasional  . Atrial fibrillation   . Hypertension   . Carotid artery occlusion 03/2011    occluded left ICA. Near occlusive disease in R ICA. s/p CEA  . Stroke 03/2011    Past Surgical History  Procedure Date  . Left breast mastectomy     breast cancer  . Resection of bowel     many years ago,small bowel obstruction  . Abdominal hysterectomy   . Appendectomy   . Left eye cataract surgery   . Carotid endarterectomy 03/2011    right    Allergies  Allergen Reactions  . Ciprofloxacin   . Other     FLUOROQUINOLONE     Review of Systems  Constitutional: Negative.   HENT: Negative.   Eyes: Negative for visual disturbance.  Respiratory: Negative.   Cardiovascular: Negative.   Gastrointestinal: Negative.   Neurological: Negative.   Hematological: Negative.        Objective:   Physical Exam  Constitutional: She is oriented to person, place, and time. She appears well-developed and well-nourished.  HENT:  Head: Normocephalic and atraumatic.  Eyes: Conjunctivae and EOM are normal. Pupils are equal, round, and reactive to light.  Neck: Normal range of motion. Neck supple.  Cardiovascular: Normal rate and regular rhythm.   No murmur heard. Pulmonary/Chest: Effort normal and breath sounds normal.  Abdominal: Soft. Bowel sounds are normal.  Musculoskeletal: Normal range  of motion.       M/S intact in all ext.  Neurological: She is alert and oriented to person, place, and time. No cranial nerve deficit.  Skin: Skin is warm and dry.       Assessment:     s/p right CEA, preoperative CVA recovered    Plan:     Pt is doing well. F/U with Dr. Darrick Penna in 6 months with carotid duplex.  Call in the interim with questions, issues, or problems  Dr. Imogene Burn is MD in clinic today.

## 2011-06-05 ENCOUNTER — Ambulatory Visit (INDEPENDENT_AMBULATORY_CARE_PROVIDER_SITE_OTHER): Payer: Self-pay | Admitting: *Deleted

## 2011-06-05 DIAGNOSIS — R0989 Other specified symptoms and signs involving the circulatory and respiratory systems: Secondary | ICD-10-CM

## 2011-06-05 LAB — POCT INR: INR: 2.8

## 2011-06-19 ENCOUNTER — Ambulatory Visit (INDEPENDENT_AMBULATORY_CARE_PROVIDER_SITE_OTHER): Payer: Medicare Other | Admitting: *Deleted

## 2011-06-19 DIAGNOSIS — I4891 Unspecified atrial fibrillation: Secondary | ICD-10-CM

## 2011-06-19 DIAGNOSIS — I6350 Cerebral infarction due to unspecified occlusion or stenosis of unspecified cerebral artery: Secondary | ICD-10-CM

## 2011-06-19 DIAGNOSIS — Z7901 Long term (current) use of anticoagulants: Secondary | ICD-10-CM

## 2011-06-19 LAB — POCT INR: INR: 2.9

## 2011-07-10 ENCOUNTER — Ambulatory Visit (INDEPENDENT_AMBULATORY_CARE_PROVIDER_SITE_OTHER): Payer: Medicare Other | Admitting: *Deleted

## 2011-07-10 DIAGNOSIS — Z7901 Long term (current) use of anticoagulants: Secondary | ICD-10-CM

## 2011-07-10 DIAGNOSIS — I4891 Unspecified atrial fibrillation: Secondary | ICD-10-CM

## 2011-07-10 DIAGNOSIS — I6350 Cerebral infarction due to unspecified occlusion or stenosis of unspecified cerebral artery: Secondary | ICD-10-CM

## 2011-07-10 LAB — POCT INR: INR: 1.9

## 2011-07-31 ENCOUNTER — Ambulatory Visit (INDEPENDENT_AMBULATORY_CARE_PROVIDER_SITE_OTHER): Payer: Medicare Other | Admitting: *Deleted

## 2011-07-31 DIAGNOSIS — I4891 Unspecified atrial fibrillation: Secondary | ICD-10-CM

## 2011-07-31 DIAGNOSIS — I6350 Cerebral infarction due to unspecified occlusion or stenosis of unspecified cerebral artery: Secondary | ICD-10-CM

## 2011-07-31 DIAGNOSIS — Z7901 Long term (current) use of anticoagulants: Secondary | ICD-10-CM

## 2011-08-10 ENCOUNTER — Ambulatory Visit (INDEPENDENT_AMBULATORY_CARE_PROVIDER_SITE_OTHER): Payer: Medicare Other | Admitting: *Deleted

## 2011-08-10 DIAGNOSIS — Z7901 Long term (current) use of anticoagulants: Secondary | ICD-10-CM

## 2011-08-10 DIAGNOSIS — I6350 Cerebral infarction due to unspecified occlusion or stenosis of unspecified cerebral artery: Secondary | ICD-10-CM

## 2011-08-10 DIAGNOSIS — I4891 Unspecified atrial fibrillation: Secondary | ICD-10-CM

## 2011-08-24 ENCOUNTER — Ambulatory Visit (INDEPENDENT_AMBULATORY_CARE_PROVIDER_SITE_OTHER): Payer: Medicare Other | Admitting: *Deleted

## 2011-08-24 DIAGNOSIS — Z7901 Long term (current) use of anticoagulants: Secondary | ICD-10-CM

## 2011-08-24 DIAGNOSIS — I6350 Cerebral infarction due to unspecified occlusion or stenosis of unspecified cerebral artery: Secondary | ICD-10-CM

## 2011-08-24 DIAGNOSIS — I4891 Unspecified atrial fibrillation: Secondary | ICD-10-CM

## 2011-08-24 MED ORDER — WARFARIN SODIUM 2 MG PO TABS: ORAL_TABLET | ORAL | Status: DC

## 2011-09-11 ENCOUNTER — Ambulatory Visit (INDEPENDENT_AMBULATORY_CARE_PROVIDER_SITE_OTHER): Payer: Medicare Other | Admitting: *Deleted

## 2011-09-11 DIAGNOSIS — I4891 Unspecified atrial fibrillation: Secondary | ICD-10-CM

## 2011-09-11 DIAGNOSIS — Z7901 Long term (current) use of anticoagulants: Secondary | ICD-10-CM

## 2011-09-11 DIAGNOSIS — I6350 Cerebral infarction due to unspecified occlusion or stenosis of unspecified cerebral artery: Secondary | ICD-10-CM

## 2011-10-02 ENCOUNTER — Other Ambulatory Visit: Payer: Self-pay | Admitting: Cardiovascular Disease

## 2011-10-02 ENCOUNTER — Ambulatory Visit (INDEPENDENT_AMBULATORY_CARE_PROVIDER_SITE_OTHER): Payer: Medicare Other | Admitting: *Deleted

## 2011-10-02 ENCOUNTER — Ambulatory Visit: Payer: Medicare Other | Admitting: Cardiovascular Disease

## 2011-10-02 DIAGNOSIS — Z7901 Long term (current) use of anticoagulants: Secondary | ICD-10-CM

## 2011-10-02 DIAGNOSIS — I4891 Unspecified atrial fibrillation: Secondary | ICD-10-CM

## 2011-10-02 DIAGNOSIS — I6350 Cerebral infarction due to unspecified occlusion or stenosis of unspecified cerebral artery: Secondary | ICD-10-CM

## 2011-10-12 ENCOUNTER — Encounter: Payer: Self-pay | Admitting: Cardiovascular Disease

## 2011-10-12 ENCOUNTER — Ambulatory Visit (INDEPENDENT_AMBULATORY_CARE_PROVIDER_SITE_OTHER): Payer: Medicare Other | Admitting: *Deleted

## 2011-10-12 ENCOUNTER — Ambulatory Visit (INDEPENDENT_AMBULATORY_CARE_PROVIDER_SITE_OTHER): Payer: Medicare Other | Admitting: Cardiovascular Disease

## 2011-10-12 DIAGNOSIS — I6350 Cerebral infarction due to unspecified occlusion or stenosis of unspecified cerebral artery: Secondary | ICD-10-CM

## 2011-10-12 DIAGNOSIS — R0600 Dyspnea, unspecified: Secondary | ICD-10-CM | POA: Insufficient documentation

## 2011-10-12 DIAGNOSIS — I4891 Unspecified atrial fibrillation: Secondary | ICD-10-CM

## 2011-10-12 DIAGNOSIS — Z7901 Long term (current) use of anticoagulants: Secondary | ICD-10-CM

## 2011-10-12 DIAGNOSIS — I6529 Occlusion and stenosis of unspecified carotid artery: Secondary | ICD-10-CM

## 2011-10-12 DIAGNOSIS — R0989 Other specified symptoms and signs involving the circulatory and respiratory systems: Secondary | ICD-10-CM

## 2011-10-12 LAB — POCT INR: INR: 1.6

## 2011-10-12 NOTE — Assessment & Plan Note (Signed)
She is due for followup in [facility] in February of 2013. She is going to have a carotid Doppler at that time.

## 2011-10-12 NOTE — Patient Instructions (Signed)
Your physician wants you to follow-up in: 6 months. You will receive a reminder letter in the mail one-two months in advance. If you don't receive a letter, please call our office to schedule the follow-up appointment. Your physician recommends that you continue on your current medications as directed. Please refer to the Current Medication list given to you today. 

## 2011-10-12 NOTE — Progress Notes (Signed)
HPI  This is an 77 year old female who is here today for a followup visit. She has chronic atrial fibrillation currently on long-term anticoagulation and rate control. She has a history of stroke and carotid disease status post endarterectomy. Overall, she has been doing reasonably well. No events since her last visit. She denies any chest pain, palpitations or dizziness. She has chronic exertional dyspnea and mild lower extremity edema.  Allergies  Allergen Reactions  . Ciprofloxacin   . Other     FLUOROQUINOLONE     Current Outpatient Prescriptions on File Prior to Visit  Medication Sig Dispense Refill  . aspirin 81 MG tablet Take 81 mg by mouth daily.        Marland Kitchen atenolol (TENORMIN) 50 MG tablet Take 50 mg by mouth daily.        . Calcium Carbonate-Vitamin D (CALCIUM + D PO) Take 1 tablet by mouth daily.        Marland Kitchen lisinopril-hydrochlorothiazide (PRINZIDE,ZESTORETIC) 20-12.5 MG per tablet Take 1 tablet by mouth daily.        . Multiple Vitamin (MULTIVITAMIN) tablet Take 1 tablet by mouth daily.        . silver sulfADIAZINE (SILVADENE) 1 % cream Apply 1 application topically 2 (two) times daily. Applied to left leg      . warfarin (COUMADIN) 2 MG tablet TAKE 2 TABLETS DAILY OR AS DIRECTED  60 tablet  3     Past Medical History  Diagnosis Date  . Breast cancer, left breast     s/p left mastectomy  . Leg wound, left   . Blindness of right eye     sustained trauma to right eye  . Dizziness     occasional  . Hypertension   . Carotid artery occlusion 03/2011    occluded left ICA. Near occlusive disease in R ICA. s/p CEA  . Stroke 03/2011  . Pulmonary hypertension     moderate  . Atrial fibrillation     normal EF     Past Surgical History  Procedure Date  . Left breast mastectomy     breast cancer  . Resection of bowel     many years ago,small bowel obstruction  . Abdominal hysterectomy   . Appendectomy   . Left eye cataract surgery   . Carotid endarterectomy 03/2011   right     Family History  Problem Relation Age of Onset  . Other      Negative for premature coronary artery disease or arrhythmia     History   Social History  . Marital Status: Divorced    Spouse Name: N/A    Number of Children: N/A  . Years of Education: N/A   Occupational History  . Not on file.   Social History Main Topics  . Smoking status: Former Smoker -- 0.5 packs/day for 35 years    Types: Cigarettes    Quit date: 10/22/1976  . Smokeless tobacco: Never Used  . Alcohol Use: No  . Drug Use: No  . Sexually Active: Not on file   Other Topics Concern  . Not on file   Social History Narrative   Lives alone and independent in ADL's.Ambulates with help of cane      PHYSICAL EXAM   BP 158/83  Pulse 55  Ht 5' (1.524 m)  Wt 191 lb (86.637 kg)  BMI 37.30 kg/m2  Constitutional: She is oriented to person, place, and time. She appears well-developed and well-nourished. No distress.  HENT: No nasal  discharge.  Head: Normocephalic and atraumatic.  Eyes: Pupils are equal, round, and reactive to light. Right eye exhibits no discharge. Left eye exhibits no discharge.  Neck: Normal range of motion. Neck supple. No JVD present. No thyromegaly present.  Cardiovascular: Normal rate, irregular rhythm, normal heart sounds and intact distal pulses. Exam reveals no gallop and no friction rub.  No murmur heard.  Pulmonary/Chest: Effort normal and breath sounds normal. No stridor. No respiratory distress. She has no wheezes. She has no rales. She exhibits no tenderness.  Abdominal: Soft. Bowel sounds are normal. She exhibits no distension. There is no tenderness. There is no rebound and no guarding.  Musculoskeletal: Normal range of motion. She exhibits +1 edema and no tenderness.  Neurological: She is alert and oriented to person, place, and time. Coordination normal.  Skin: Skin is warm and dry. No rash noted. She is not diaphoretic. No erythema. No pallor.  Psychiatric:  She has a normal mood and affect. Her behavior is normal. Judgment and thought content normal.      ASSESSMENT AND PLAN

## 2011-10-12 NOTE — Assessment & Plan Note (Signed)
This is likely multifactorial due to physical deconditioning, pulmonary hypertension as well as diastolic dysfunction. Her EF was normal by echo. This seems to be stable. Continue current management. If her symptoms worsen, consider adding a small dose furosemide. She is already on a small dose thiazide diuretic.

## 2011-10-12 NOTE — Assessment & Plan Note (Signed)
Continue rate control with atenolol and long-term anticoagulation with warfarin for reduction of stroke risk. She is slightly bradycardic but overall asymptomatic.

## 2011-10-30 ENCOUNTER — Ambulatory Visit (INDEPENDENT_AMBULATORY_CARE_PROVIDER_SITE_OTHER): Payer: Medicare Other | Admitting: *Deleted

## 2011-10-30 DIAGNOSIS — Z7901 Long term (current) use of anticoagulants: Secondary | ICD-10-CM

## 2011-10-30 DIAGNOSIS — I6350 Cerebral infarction due to unspecified occlusion or stenosis of unspecified cerebral artery: Secondary | ICD-10-CM

## 2011-10-30 DIAGNOSIS — I4891 Unspecified atrial fibrillation: Secondary | ICD-10-CM

## 2011-11-04 DIAGNOSIS — R0602 Shortness of breath: Secondary | ICD-10-CM

## 2011-11-04 DIAGNOSIS — I4891 Unspecified atrial fibrillation: Secondary | ICD-10-CM

## 2011-11-05 DIAGNOSIS — I5031 Acute diastolic (congestive) heart failure: Secondary | ICD-10-CM

## 2011-11-05 DIAGNOSIS — I5033 Acute on chronic diastolic (congestive) heart failure: Secondary | ICD-10-CM

## 2011-11-05 DIAGNOSIS — I4891 Unspecified atrial fibrillation: Secondary | ICD-10-CM

## 2011-11-07 DIAGNOSIS — I5032 Chronic diastolic (congestive) heart failure: Secondary | ICD-10-CM

## 2011-11-13 ENCOUNTER — Ambulatory Visit: Payer: Self-pay | Admitting: *Deleted

## 2011-11-13 ENCOUNTER — Encounter: Payer: Medicare Other | Admitting: *Deleted

## 2011-11-13 DIAGNOSIS — I6350 Cerebral infarction due to unspecified occlusion or stenosis of unspecified cerebral artery: Secondary | ICD-10-CM

## 2011-11-13 DIAGNOSIS — Z7901 Long term (current) use of anticoagulants: Secondary | ICD-10-CM

## 2011-11-13 DIAGNOSIS — I4891 Unspecified atrial fibrillation: Secondary | ICD-10-CM

## 2011-11-20 ENCOUNTER — Encounter: Payer: Medicare Other | Admitting: *Deleted

## 2011-11-26 ENCOUNTER — Ambulatory Visit (INDEPENDENT_AMBULATORY_CARE_PROVIDER_SITE_OTHER): Payer: Medicare Other | Admitting: Cardiovascular Disease

## 2011-11-26 ENCOUNTER — Encounter: Payer: Self-pay | Admitting: Cardiovascular Disease

## 2011-11-26 DIAGNOSIS — I509 Heart failure, unspecified: Secondary | ICD-10-CM

## 2011-11-26 DIAGNOSIS — I4891 Unspecified atrial fibrillation: Secondary | ICD-10-CM

## 2011-11-26 DIAGNOSIS — I10 Essential (primary) hypertension: Secondary | ICD-10-CM

## 2011-11-26 DIAGNOSIS — I5032 Chronic diastolic (congestive) heart failure: Secondary | ICD-10-CM

## 2011-11-26 MED ORDER — METOPROLOL TARTRATE 50 MG PO TABS: 50.0000 mg | ORAL_TABLET | Freq: Two times a day (BID) | ORAL | Status: DC

## 2011-11-26 MED ORDER — FUROSEMIDE 20 MG PO TABS: 20.0000 mg | ORAL_TABLET | Freq: Every day | ORAL | Status: DC

## 2011-11-26 NOTE — Patient Instructions (Signed)
Follow up as scheduled with [facility]. Your physician wants you to follow-up in: 3 months. You will receive a reminder letter in the mail one-two months in advance. If you don't receive a letter, please call our office to schedule the follow-up appointment. Your physician recommends that you continue on your current medications as directed. Please refer to the Current Medication list given to you today.

## 2011-11-26 NOTE — Assessment & Plan Note (Signed)
Her heart rate is currently well controlled. I switched her from atenolol to metoprolol due to chronic kidney disease and also to achieve better heart rate control. Continue current dose. Continue anticoagulation with warfarin. Will reschedule her appointment with the anticoagulation clinic.

## 2011-11-26 NOTE — Progress Notes (Signed)
HPI  This is an 78 year old female who is here today for a followup visit after recent hospitalization. She was hospitalized at [facility] due to mild heart failure due to diastolic dysfunction, COPD and atrial fibrillation with rapid ventricular response. Her atenolol was switched to metoprolol. She was given IV furosemide for a few days as well as nebulized breathing treatments. Her renal function worsened and diuretics were held. She was discharged to a rehabilitation on Lasix 20 mg by mouth once daily. She had had labs checked subsequently which showed return of creatinine to 1.2 which is close to her baseline. Overall, she is feeling much better. Her dyspnea has improved. She finished rehabilitation and currently she is back home. She gets home physical therapy.  Allergies  Allergen Reactions  . Ciprofloxacin   . Other     FLUOROQUINOLONE     Current Outpatient Prescriptions on File Prior to Visit  Medication Sig Dispense Refill  . aspirin 81 MG tablet Take 81 mg by mouth daily.        . Calcium Carbonate-Vitamin D (CALCIUM + D PO) Take 1 tablet by mouth 2 (two) times daily.       . Multiple Vitamin (MULTIVITAMIN) tablet Take 1 tablet by mouth daily.        Marland Kitchen warfarin (COUMADIN) 2 MG tablet TAKE 2 TABLETS DAILY OR AS DIRECTED  60 tablet  3     Past Medical History  Diagnosis Date  . Breast cancer, left breast     s/p left mastectomy  . Leg wound, left   . Blindness of right eye     sustained trauma to right eye  . Dizziness     occasional  . Hypertension   . Carotid artery occlusion 03/2011    occluded left ICA. Near occlusive disease in R ICA. s/p CEA  . Stroke 03/2011  . Pulmonary hypertension     moderate  . Atrial fibrillation     normal EF  . COPD (chronic obstructive pulmonary disease)   . Chronic diastolic congestive heart failure      Past Surgical History  Procedure Date  . Left breast mastectomy     breast cancer  . Resection of bowel     many  years ago,small bowel obstruction  . Abdominal hysterectomy   . Appendectomy   . Left eye cataract surgery   . Carotid endarterectomy 03/2011    right     Family History  Problem Relation Age of Onset  . Other      Negative for premature coronary artery disease or arrhythmia     History   Social History  . Marital Status: Divorced    Spouse Name: N/A    Number of Children: N/A  . Years of Education: N/A   Occupational History  . Not on file.   Social History Main Topics  . Smoking status: Former Smoker -- 0.5 packs/day for 35 years    Types: Cigarettes    Quit date: 10/22/1976  . Smokeless tobacco: Never Used  . Alcohol Use: No  . Drug Use: No  . Sexually Active: Not on file   Other Topics Concern  . Not on file   Social History Narrative   Lives alone and independent in ADL's.Ambulates with help of cane     PHYSICAL EXAM   BP 122/71  Pulse 60  Ht 5' (1.524 m)  Wt 192 lb (87.091 kg)  BMI 37.50 kg/m2  SpO2 96%  Constitutional: She is oriented  to person, place, and time. She appears well-developed and well-nourished. No distress.  HENT: No nasal discharge.  Head: Normocephalic and atraumatic.  Eyes: Pupils are equal and round. Right eye exhibits no discharge. Left eye exhibits no discharge.  Neck: Normal range of motion. Neck supple. No JVD present. No thyromegaly present.  Cardiovascular: Normal rate, regular rhythm, normal heart sounds. Exam reveals no gallop and no friction rub. No murmur heard.  Pulmonary/Chest: Effort normal and breath sounds normal. No stridor. No respiratory distress. She has no wheezes. She has no rales. She exhibits no tenderness.  Abdominal: Soft. Bowel sounds are normal. She exhibits no distension. There is no tenderness. There is no rebound and no guarding.  Musculoskeletal: Normal range of motion. She exhibits no edema and no tenderness.  Neurological: She is alert and oriented to person, place, and time. Coordination normal.    Skin: Skin is warm and dry. No rash noted. She is not diaphoretic. No erythema. No pallor.  Psychiatric: She has a normal mood and affect. Her behavior is normal. Judgment and thought content normal.      ASSESSMENT AND PLAN

## 2011-11-26 NOTE — Assessment & Plan Note (Signed)
The patient had a recent heart failure exacerbation. She seems to be euvolemic at this time on current dose of Lasix 20 mg once daily. Her creatinine was 1.2 which is close to her baseline. Given that her EF is normal, I would not add an ACE inhibitor at this time.

## 2011-11-26 NOTE — Assessment & Plan Note (Signed)
Her blood pressure is well controlled. Continue metoprolol.

## 2011-11-30 ENCOUNTER — Encounter: Payer: Medicare Other | Admitting: *Deleted

## 2011-11-30 ENCOUNTER — Ambulatory Visit (INDEPENDENT_AMBULATORY_CARE_PROVIDER_SITE_OTHER): Payer: Self-pay | Admitting: *Deleted

## 2011-11-30 DIAGNOSIS — R0989 Other specified symptoms and signs involving the circulatory and respiratory systems: Secondary | ICD-10-CM

## 2011-11-30 DIAGNOSIS — Z7901 Long term (current) use of anticoagulants: Secondary | ICD-10-CM

## 2011-11-30 DIAGNOSIS — I4891 Unspecified atrial fibrillation: Secondary | ICD-10-CM

## 2011-11-30 DIAGNOSIS — I6350 Cerebral infarction due to unspecified occlusion or stenosis of unspecified cerebral artery: Secondary | ICD-10-CM

## 2011-11-30 LAB — PROTIME-INR: INR: 4.5 — AB (ref 0.9–1.1)

## 2011-12-03 ENCOUNTER — Ambulatory Visit (INDEPENDENT_AMBULATORY_CARE_PROVIDER_SITE_OTHER): Payer: Self-pay | Admitting: *Deleted

## 2011-12-03 DIAGNOSIS — R0989 Other specified symptoms and signs involving the circulatory and respiratory systems: Secondary | ICD-10-CM

## 2011-12-03 DIAGNOSIS — I6350 Cerebral infarction due to unspecified occlusion or stenosis of unspecified cerebral artery: Secondary | ICD-10-CM

## 2011-12-03 DIAGNOSIS — Z7901 Long term (current) use of anticoagulants: Secondary | ICD-10-CM

## 2011-12-03 DIAGNOSIS — I4891 Unspecified atrial fibrillation: Secondary | ICD-10-CM

## 2011-12-03 LAB — POCT INR: INR: 3.1

## 2011-12-06 ENCOUNTER — Other Ambulatory Visit (INDEPENDENT_AMBULATORY_CARE_PROVIDER_SITE_OTHER): Payer: Medicare Other | Admitting: Vascular Surgery

## 2011-12-06 ENCOUNTER — Ambulatory Visit: Payer: Medicare Other | Admitting: Vascular Surgery

## 2011-12-06 DIAGNOSIS — I6529 Occlusion and stenosis of unspecified carotid artery: Secondary | ICD-10-CM

## 2011-12-06 DIAGNOSIS — Z48812 Encounter for surgical aftercare following surgery on the circulatory system: Secondary | ICD-10-CM

## 2011-12-07 ENCOUNTER — Ambulatory Visit (INDEPENDENT_AMBULATORY_CARE_PROVIDER_SITE_OTHER): Payer: Self-pay | Admitting: *Deleted

## 2011-12-07 DIAGNOSIS — Z7901 Long term (current) use of anticoagulants: Secondary | ICD-10-CM

## 2011-12-07 DIAGNOSIS — I6350 Cerebral infarction due to unspecified occlusion or stenosis of unspecified cerebral artery: Secondary | ICD-10-CM

## 2011-12-07 DIAGNOSIS — R0989 Other specified symptoms and signs involving the circulatory and respiratory systems: Secondary | ICD-10-CM

## 2011-12-07 DIAGNOSIS — I4891 Unspecified atrial fibrillation: Secondary | ICD-10-CM

## 2011-12-11 ENCOUNTER — Ambulatory Visit (INDEPENDENT_AMBULATORY_CARE_PROVIDER_SITE_OTHER): Payer: Self-pay | Admitting: *Deleted

## 2011-12-11 DIAGNOSIS — R0989 Other specified symptoms and signs involving the circulatory and respiratory systems: Secondary | ICD-10-CM

## 2011-12-11 DIAGNOSIS — I4891 Unspecified atrial fibrillation: Secondary | ICD-10-CM

## 2011-12-11 DIAGNOSIS — I6350 Cerebral infarction due to unspecified occlusion or stenosis of unspecified cerebral artery: Secondary | ICD-10-CM

## 2011-12-11 DIAGNOSIS — Z7901 Long term (current) use of anticoagulants: Secondary | ICD-10-CM

## 2011-12-11 LAB — POCT INR: INR: 2.8

## 2011-12-14 NOTE — Procedures (Unsigned)
CAROTID DUPLEX EXAM  INDICATION:  Carotid stenosis.  HISTORY: Diabetes:  No Cardiac:  Atrial fibrillation Hypertension:  Yes Smoking:  No Previous Surgery:  Right carotid endarterectomy on 05/01/2011. CV History:  History of CVA June 2012 Amaurosis Fugax No, Paresthesias No, Hemiparesis No                                      RIGHT             LEFT Brachial systolic pressure:         170               168 Brachial Doppler waveforms:         WNL               WNL Vertebral direction of flow:        Antegrade         Antegrade DUPLEX VELOCITIES (cm/sec) CCA peak systolic                   117               71 ECA peak systolic                   151               143 ICA peak systolic                   124               Occluded ICA end diastolic                   31                Occluded PLAQUE MORPHOLOGY:                  Heterogenous      Heterogenous PLAQUE AMOUNT:                      Mild              Occlusive PLAQUE LOCATION:                    CCA               ICA  IMPRESSION: 1. Right internal carotid artery is patent with history of     endarterectomy, no hyperplasia or hemodynamically significant     plaque is identified. 2. Left internal carotid artery presents with chronic known occlusion. 3. Bilateral vertebral bodies are patent and antegrade. 4. Bilateral external carotid arteries are patent and antegrade.  ___________________________________________ Janetta Hora. Fields, MD  SH/MEDQ  D:  12/06/2011  T:  12/06/2011  Job:  [id number]

## 2011-12-18 ENCOUNTER — Ambulatory Visit: Payer: Self-pay | Admitting: *Deleted

## 2011-12-18 DIAGNOSIS — Z7901 Long term (current) use of anticoagulants: Secondary | ICD-10-CM

## 2011-12-18 DIAGNOSIS — I6350 Cerebral infarction due to unspecified occlusion or stenosis of unspecified cerebral artery: Secondary | ICD-10-CM

## 2011-12-18 DIAGNOSIS — I4891 Unspecified atrial fibrillation: Secondary | ICD-10-CM

## 2011-12-18 LAB — POCT INR: INR: 2.2

## 2011-12-27 ENCOUNTER — Ambulatory Visit: Payer: Self-pay | Admitting: *Deleted

## 2011-12-27 DIAGNOSIS — I4891 Unspecified atrial fibrillation: Secondary | ICD-10-CM

## 2011-12-27 DIAGNOSIS — Z7901 Long term (current) use of anticoagulants: Secondary | ICD-10-CM

## 2011-12-27 DIAGNOSIS — I6350 Cerebral infarction due to unspecified occlusion or stenosis of unspecified cerebral artery: Secondary | ICD-10-CM

## 2012-01-07 ENCOUNTER — Ambulatory Visit: Payer: Self-pay | Admitting: *Deleted

## 2012-01-07 DIAGNOSIS — I4891 Unspecified atrial fibrillation: Secondary | ICD-10-CM

## 2012-01-07 DIAGNOSIS — Z7901 Long term (current) use of anticoagulants: Secondary | ICD-10-CM

## 2012-01-07 DIAGNOSIS — I6350 Cerebral infarction due to unspecified occlusion or stenosis of unspecified cerebral artery: Secondary | ICD-10-CM

## 2012-01-07 LAB — POCT INR: INR: 1.7

## 2012-01-16 ENCOUNTER — Encounter: Payer: Self-pay | Admitting: Vascular Surgery

## 2012-01-17 ENCOUNTER — Ambulatory Visit (INDEPENDENT_AMBULATORY_CARE_PROVIDER_SITE_OTHER): Payer: Medicare Other | Admitting: Vascular Surgery

## 2012-01-17 ENCOUNTER — Encounter: Payer: Self-pay | Admitting: Vascular Surgery

## 2012-01-17 ENCOUNTER — Ambulatory Visit (INDEPENDENT_AMBULATORY_CARE_PROVIDER_SITE_OTHER): Payer: Medicare Other | Admitting: *Deleted

## 2012-01-17 VITALS — BP 161/73 | HR 79 | Temp 97.6°F | Resp 16 | Ht 63.0 in | Wt 189.0 lb

## 2012-01-17 DIAGNOSIS — I4891 Unspecified atrial fibrillation: Secondary | ICD-10-CM

## 2012-01-17 DIAGNOSIS — I739 Peripheral vascular disease, unspecified: Secondary | ICD-10-CM

## 2012-01-17 DIAGNOSIS — I6350 Cerebral infarction due to unspecified occlusion or stenosis of unspecified cerebral artery: Secondary | ICD-10-CM

## 2012-01-17 DIAGNOSIS — L98499 Non-pressure chronic ulcer of skin of other sites with unspecified severity: Secondary | ICD-10-CM

## 2012-01-17 DIAGNOSIS — Z7901 Long term (current) use of anticoagulants: Secondary | ICD-10-CM

## 2012-01-17 LAB — POCT INR: INR: 3.1

## 2012-01-17 NOTE — Progress Notes (Signed)
[facility] [place] HISTORY AND PHYSICAL   History of Present Illness:  Patient is a 78 y.o. year old female who presents for evaluation of a nonhealing ulcer of her left lower extremity.  The patient is not in any and previous underwent right carotid endarterectomy for symptomatic carotid stenosis in July 2012. She is referred today by Dr. Tanda Rockers for evaluation of a nonhealing ulcer of her left foot. She has been currently receiving compression dressings at the wound center at [facility]. Her son is present for the office visit today and states that the ulcer has almost healed at this point Other medical problems include congestive heart failure, pulmonary hypertension, atrial fibrillation, COPD. These problems are currently stable and followed by her primary care physician.  Past Medical History  Diagnosis Date  . Breast cancer, left breast     s/p left mastectomy  . Leg wound, left   . Blindness of right eye     sustained trauma to right eye  . Dizziness     occasional  . Hypertension   . Carotid artery occlusion 03/2011    occluded left ICA. Near occlusive disease in R ICA. s/p CEA  . Stroke 03/2011  . Pulmonary hypertension     moderate  . Atrial fibrillation     normal EF  . COPD (chronic obstructive pulmonary disease)   . Chronic diastolic congestive heart failure     Past Surgical History  Procedure Date  . Left breast mastectomy     breast cancer  . Resection of bowel     many years ago,small bowel obstruction  . Abdominal hysterectomy   . Appendectomy   . Left eye cataract surgery   . Carotid endarterectomy 03/2011    right     Social History History  Substance Use Topics  . Smoking status: Former Smoker -- 0.5 packs/day for 35 years    Types: Cigarettes    Quit date: 10/22/1976  . Smokeless tobacco: Never Used  . Alcohol Use: No    Family History Family History  Problem Relation Age of Onset  . Other      Negative for premature  coronary artery disease or arrhythmia    Allergies  Allergies  Allergen Reactions  . Ciprofloxacin   . Other     FLUOROQUINOLONE     Current Outpatient Prescriptions  Medication Sig Dispense Refill  . aspirin 81 MG tablet Take 81 mg by mouth daily.        . benzonatate (TESSALON) 100 MG capsule Take 100 mg by mouth 3 (three) times daily as needed.      . Calcium Carbonate-Vitamin D (CALCIUM + D PO) Take 1 tablet by mouth 2 (two) times daily.       . cholecalciferol (VITAMIN D) 1000 UNITS tablet Take 1,000 Units by mouth daily.      . furosemide (LASIX) 20 MG tablet Take 1 tablet (20 mg total) by mouth daily.  30 tablet  4  . guaiFENesin-codeine (ROBITUSSIN AC) 100-10 MG/5ML syrup Take 10 mLs by mouth every 4 (four) hours as needed.      . metoprolol (LOPRESSOR) 50 MG tablet Take 1 tablet (50 mg total) by mouth 2 (two) times daily.  60 tablet  4  . Multiple Vitamin (MULTIVITAMIN) tablet Take 1 tablet by mouth daily.        . Nutritional Supplements (RESOURCE ARGINAID EXTRA PO) Take 1 packet by mouth 2 (two) times daily.      Marland Kitchen  vitamin C (ASCORBIC ACID) 500 MG tablet Take 500 mg by mouth daily.      Marland Kitchen warfarin (COUMADIN) 2 MG tablet TAKE 2 TABLETS DAILY OR AS DIRECTED  60 tablet  3    ROS:   General:  No weight loss, Fever, chills  HEENT: No recent headaches, no nasal bleeding, no visual changes, no sore throat  Neurologic: No dizziness, blackouts, seizures. No recent symptoms of stroke or mini- stroke. No recent episodes of slurred speech, or temporary blindness.  Cardiac: No recent episodes of chest pain/pressure, no shortness of breath at rest.  No shortness of breath with exertion.    Vascular: No history of rest pain in feet.  No history of claudication.  No history of DVT   Pulmonary: no productive cough, no hemoptysis,  No asthma or wheezing  Musculoskeletal:  [ ]  Arthritis, [ ]  Low back pain,  [ ]  Joint pain  Hematologic:No history of hypercoagulable state.  No  history of easy bleeding.  No history of anemia  Gastrointestinal: No hematochezia or melena,  No gastroesophageal reflux, no trouble swallowing  Urinary: [ ]  chronic Kidney disease, [ ]  on HD - [ ]  MWF or [ ]  TTHS, [ ]  Burning with urination, [ ]  Frequent urination, [ ]  Difficulty urinating;   Skin: No rashes  Psychological: No history of anxiety,  No history of depression   Physical Examination  Filed Vitals:   01/17/12 1303  BP: 161/73  Pulse: 79  Temp: 97.6 F (36.4 C)  TempSrc: Oral  Resp: 16  Height: 5\' 3"  (1.6 m)  Weight: 189 lb (85.73 kg)    Body mass index is 33.48 kg/(m^2).  General:  Alert and oriented, no acute distress HEENT: Normal Neck: No bruit or JVD Pulmonary: Clear to auscultation bilaterally Cardiac: Regular Rate and Rhythm without murmur Abdomen: Soft, non-tender, non-distended, no mass, no scars Skin: No rash, very superficial ulceration nearly healed left lateral foot Extremity Pulses:  2+ radial, brachial, femoral, absent dorsalis pedis, posterior tibial pulses bilaterally Musculoskeletal: No deformity or edema  Neurologic: Upper and lower extremity motor 5/5 and symmetric  DATA: I reviewed her previous ABIs dated January 13 which showed ABI on the right 0.7 and left 0.96, she also had a carotid duplex exam on February 13 which showed chronic occlusion of her left internal carotid artery and no significant right internal carotid artery stenosis post endarterectomy   ASSESSMENT: Healing ulceration left leg with adequate perfusion for wound healing based on her ABIs   PLAN:  Continue followup with Dr. Tanda Rockers at the wound center. She will followup with [place] with her carotid protocol exam. If the wound deteriorates further we can always reconsider further evaluation of her lower extremities.   Fabienne Bruns, MD [facility] [place] Office: [phone] Pager: [phone]   Fabienne Bruns, MD [facility] [place] Office: [phone] Pager: [phone]

## 2012-01-21 ENCOUNTER — Encounter: Payer: Self-pay | Admitting: Surgery

## 2012-01-24 ENCOUNTER — Ambulatory Visit (INDEPENDENT_AMBULATORY_CARE_PROVIDER_SITE_OTHER): Payer: Medicare Other | Admitting: *Deleted

## 2012-01-24 DIAGNOSIS — I4891 Unspecified atrial fibrillation: Secondary | ICD-10-CM

## 2012-01-24 DIAGNOSIS — Z7901 Long term (current) use of anticoagulants: Secondary | ICD-10-CM

## 2012-01-24 DIAGNOSIS — I6350 Cerebral infarction due to unspecified occlusion or stenosis of unspecified cerebral artery: Secondary | ICD-10-CM

## 2012-01-24 LAB — POCT INR: INR: 1.9

## 2012-02-08 ENCOUNTER — Ambulatory Visit (INDEPENDENT_AMBULATORY_CARE_PROVIDER_SITE_OTHER): Payer: Medicare Other | Admitting: *Deleted

## 2012-02-08 DIAGNOSIS — I6350 Cerebral infarction due to unspecified occlusion or stenosis of unspecified cerebral artery: Secondary | ICD-10-CM

## 2012-02-08 DIAGNOSIS — Z7901 Long term (current) use of anticoagulants: Secondary | ICD-10-CM

## 2012-02-08 DIAGNOSIS — I4891 Unspecified atrial fibrillation: Secondary | ICD-10-CM

## 2012-02-24 IMAGING — CR DG CHEST 2V
2 series · 2 of 2 positions shown · non-contrast
Comparison: 04/29/2011

CLINICAL DATA: Preop

CHEST - 2 VIEW

[w chest lat * (1 of 2)]
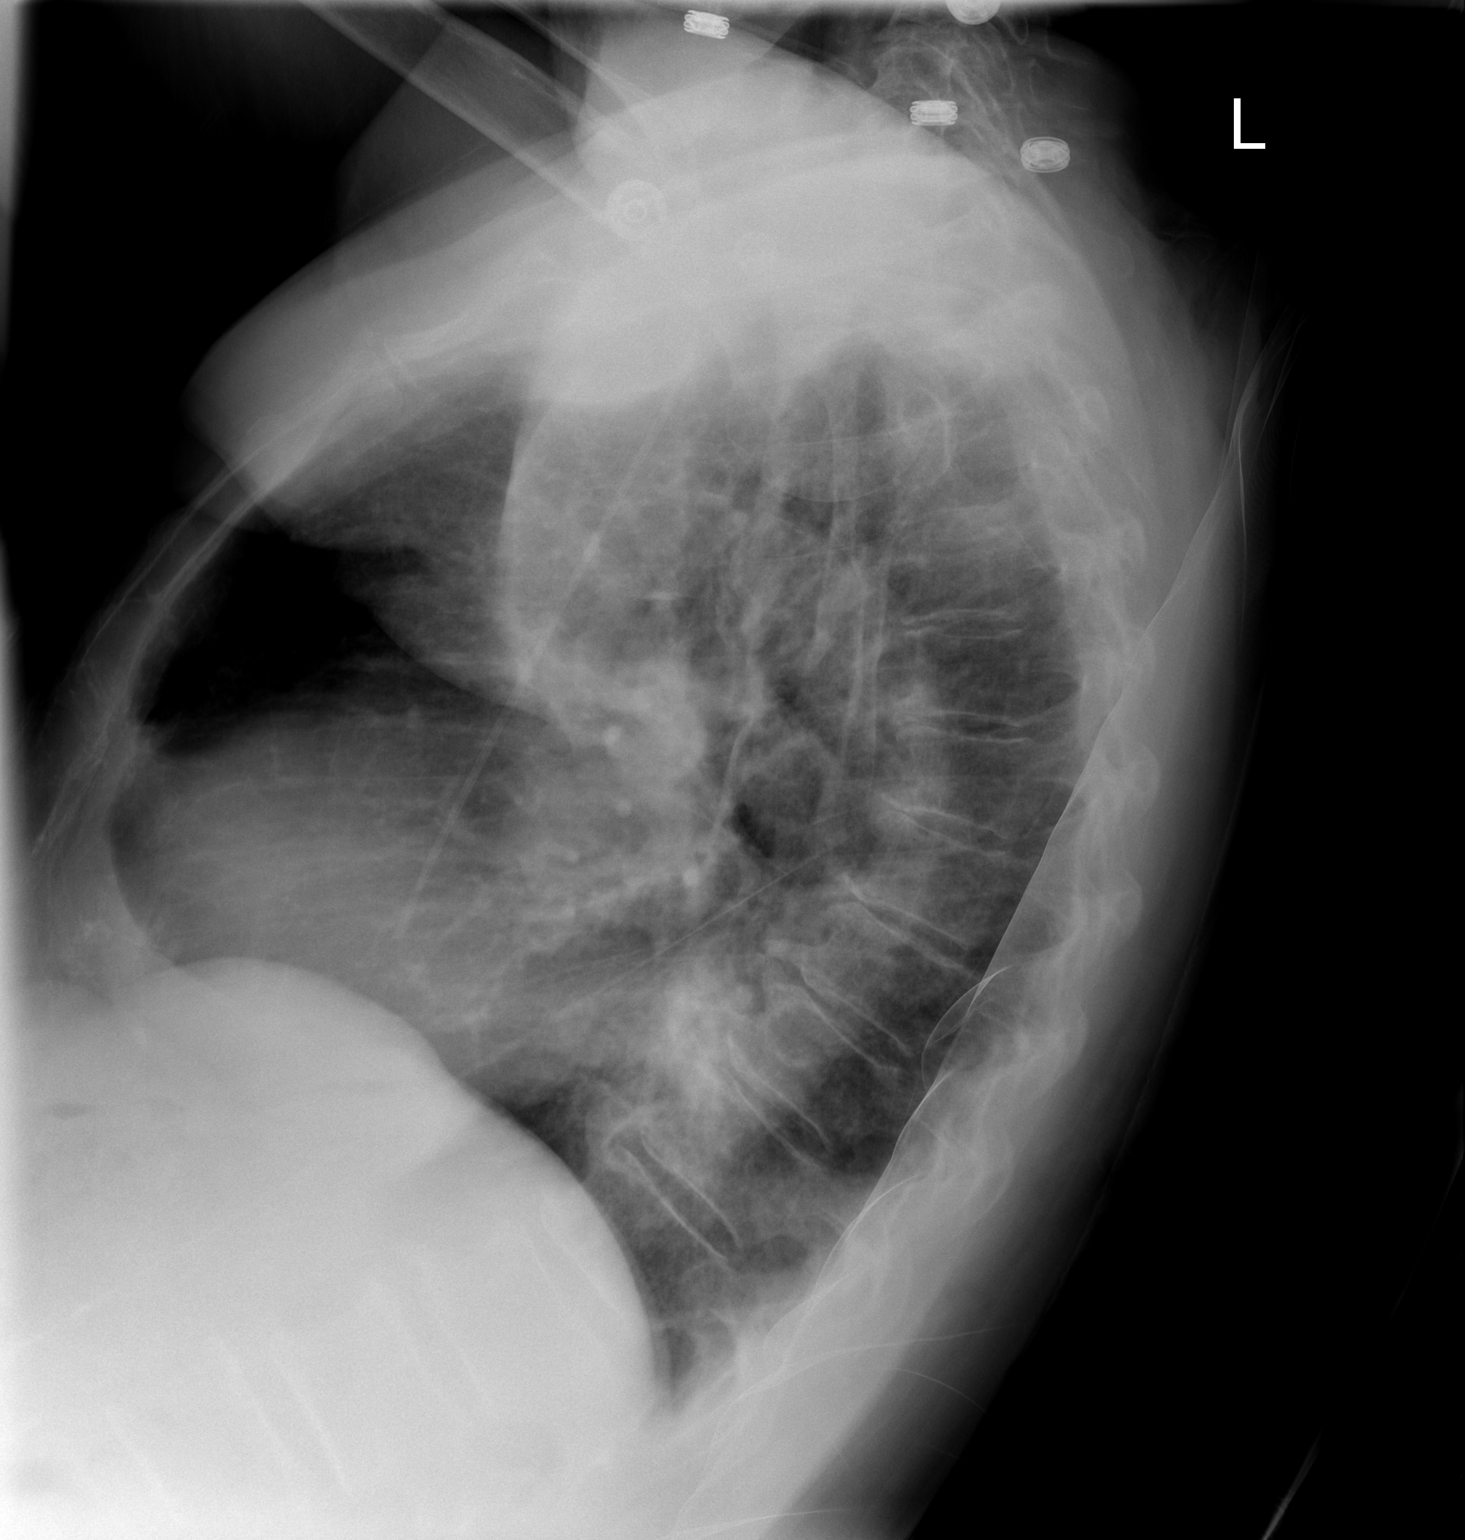

[w chest lat * (2 of 2)]
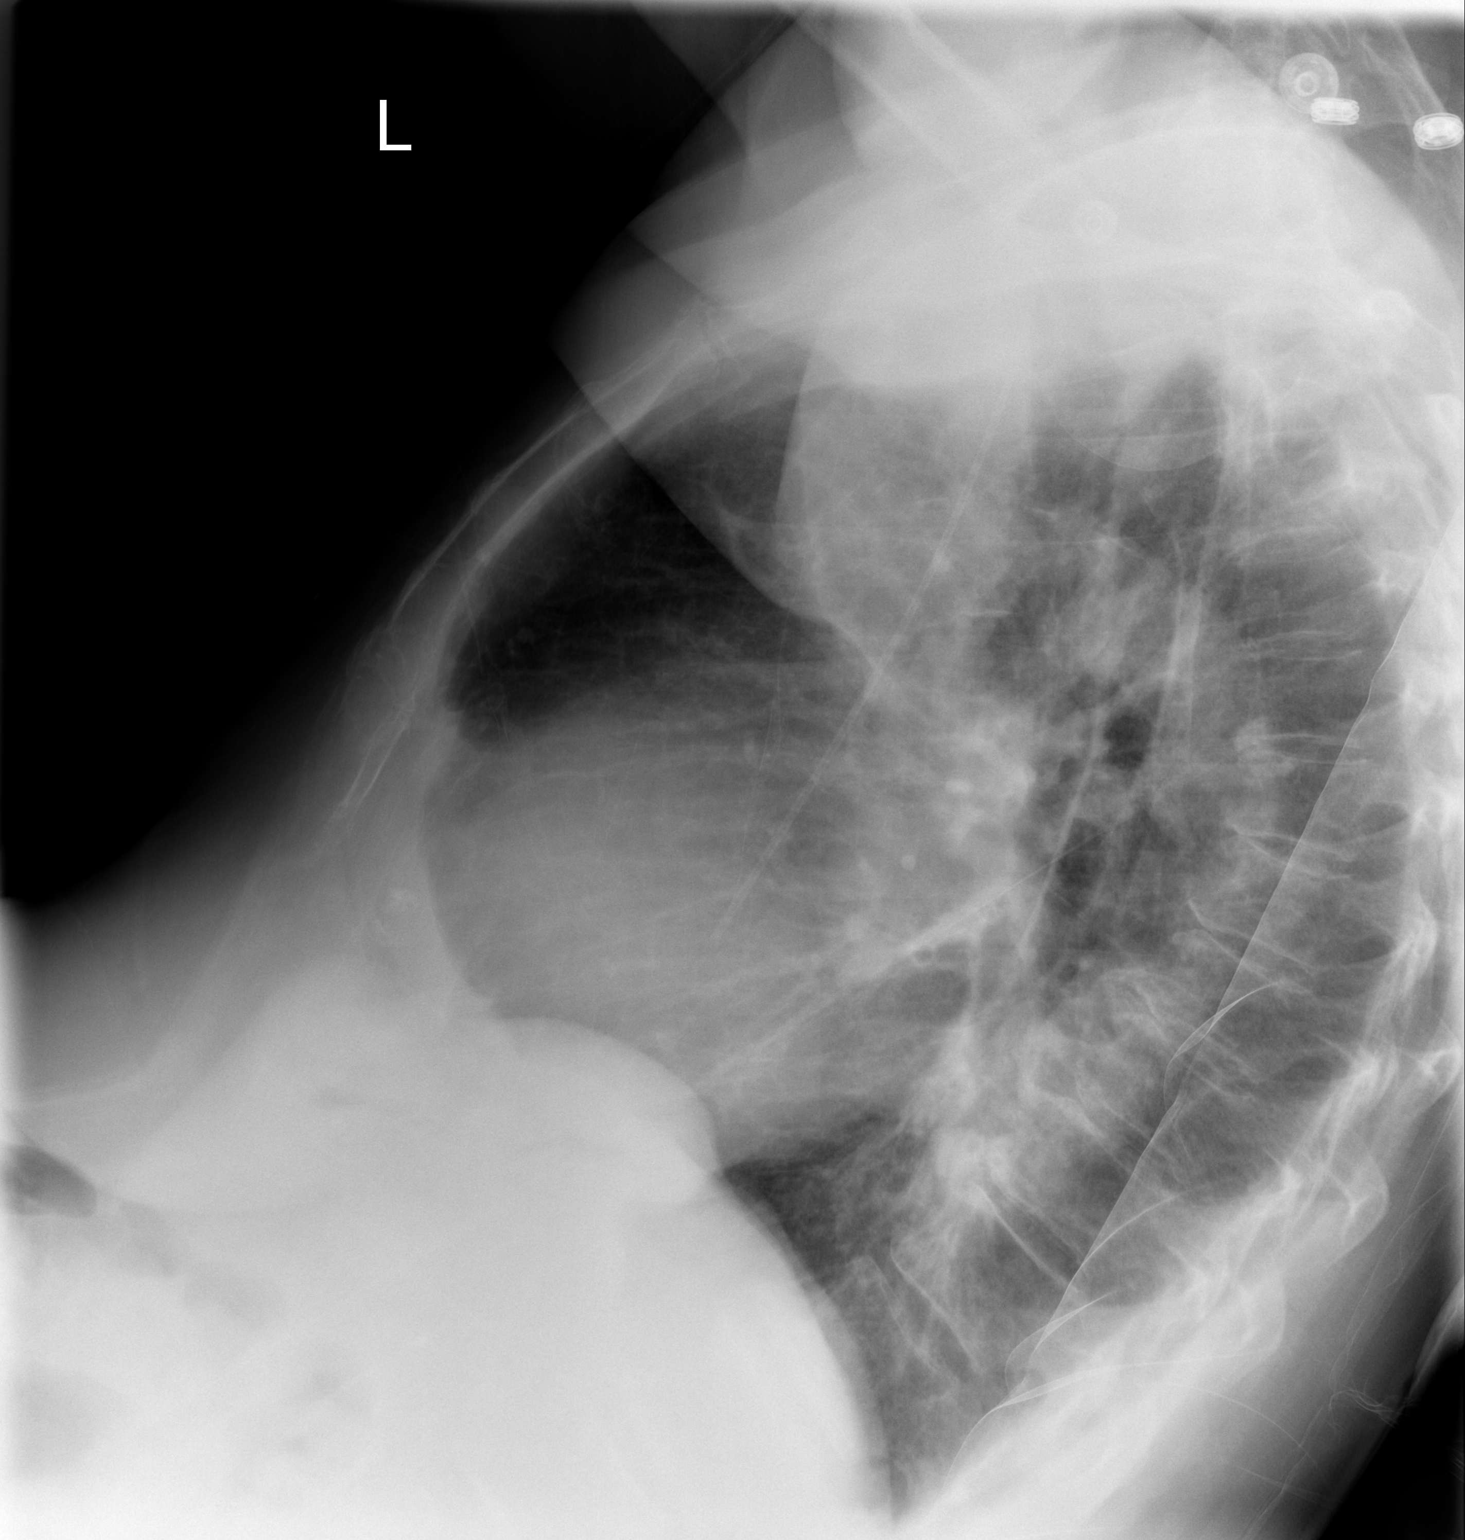

[2 of 2 positions shown; findings below may reference images not displayed]

FINDINGS: Lungs are clear. No pleural effusion or pneumothorax.

Stable cardiomegaly.

Right PICC with its tip in the mid SVC.

Degenerative changes of the visualized thoracolumbar spine.
IMPRESSION: No evidence of acute cardiopulmonary disease.

Stable cardiomegaly.

## 2012-02-29 ENCOUNTER — Ambulatory Visit (INDEPENDENT_AMBULATORY_CARE_PROVIDER_SITE_OTHER): Payer: Medicare Other | Admitting: *Deleted

## 2012-02-29 DIAGNOSIS — Z7901 Long term (current) use of anticoagulants: Secondary | ICD-10-CM

## 2012-02-29 DIAGNOSIS — I6350 Cerebral infarction due to unspecified occlusion or stenosis of unspecified cerebral artery: Secondary | ICD-10-CM

## 2012-02-29 DIAGNOSIS — I4891 Unspecified atrial fibrillation: Secondary | ICD-10-CM

## 2012-02-29 LAB — POCT INR: INR: 1.9

## 2012-03-21 ENCOUNTER — Ambulatory Visit (INDEPENDENT_AMBULATORY_CARE_PROVIDER_SITE_OTHER): Payer: Medicare Other | Admitting: *Deleted

## 2012-03-21 DIAGNOSIS — Z7901 Long term (current) use of anticoagulants: Secondary | ICD-10-CM

## 2012-03-21 DIAGNOSIS — I6350 Cerebral infarction due to unspecified occlusion or stenosis of unspecified cerebral artery: Secondary | ICD-10-CM

## 2012-03-21 DIAGNOSIS — I4891 Unspecified atrial fibrillation: Secondary | ICD-10-CM

## 2012-03-21 LAB — POCT INR: INR: 1.9

## 2012-03-25 ENCOUNTER — Ambulatory Visit (INDEPENDENT_AMBULATORY_CARE_PROVIDER_SITE_OTHER): Payer: Medicare Other | Admitting: Cardiology

## 2012-03-25 ENCOUNTER — Encounter: Payer: Self-pay | Admitting: Cardiology

## 2012-03-25 VITALS — BP 147/71 | HR 61 | Resp 18 | Ht 60.0 in | Wt 186.0 lb

## 2012-03-25 DIAGNOSIS — I4891 Unspecified atrial fibrillation: Secondary | ICD-10-CM

## 2012-03-25 DIAGNOSIS — I6529 Occlusion and stenosis of unspecified carotid artery: Secondary | ICD-10-CM

## 2012-03-25 DIAGNOSIS — I509 Heart failure, unspecified: Secondary | ICD-10-CM

## 2012-03-25 DIAGNOSIS — I10 Essential (primary) hypertension: Secondary | ICD-10-CM

## 2012-03-25 DIAGNOSIS — I5032 Chronic diastolic (congestive) heart failure: Secondary | ICD-10-CM

## 2012-03-25 NOTE — Progress Notes (Signed)
HPI The patient presents for follow up of atrial fib.  She previous saw Dr. Kirke Corin.  Since last being seen she has had no new cardiovascular problems.  She does not notice palpitations unless she is really hurrying.  She denies any presyncope or syncope. She has had no chest pressure, neck or arm discomfort. She has had no new shortness of breath, PND or orthopnea. She denies any weight gain or edema. She gets around her house with her walker.  Allergies  Allergen Reactions  . Ciprofloxacin   . Other     FLUOROQUINOLONE    Current Outpatient Prescriptions  Medication Sig Dispense Refill  . aspirin 81 MG tablet Take 81 mg by mouth daily.        . benzonatate (TESSALON) 100 MG capsule Take 100 mg by mouth 3 (three) times daily as needed.      . Calcium Carbonate-Vitamin D (CALCIUM + D PO) Take 1 tablet by mouth 2 (two) times daily.       . cholecalciferol (VITAMIN D) 1000 UNITS tablet Take 1,000 Units by mouth daily.      . furosemide (LASIX) 20 MG tablet Take 1 tablet (20 mg total) by mouth daily.  30 tablet  4  . guaiFENesin-codeine (ROBITUSSIN AC) 100-10 MG/5ML syrup Take 10 mLs by mouth every 4 (four) hours as needed.      . metoprolol (LOPRESSOR) 50 MG tablet Take 1 tablet (50 mg total) by mouth 2 (two) times daily.  60 tablet  4  . Multiple Vitamin (MULTIVITAMIN) tablet Take 1 tablet by mouth daily.        . Nutritional Supplements (RESOURCE ARGINAID EXTRA PO) Take 1 packet by mouth 2 (two) times daily.      . vitamin C (ASCORBIC ACID) 500 MG tablet Take 500 mg by mouth daily.      Marland Kitchen warfarin (COUMADIN) 2 MG tablet TAKE 2 TABLETS DAILY OR AS DIRECTED  60 tablet  3    Past Medical History  Diagnosis Date  . Breast cancer, left breast     s/p left mastectomy  . Leg wound, left   . Blindness of right eye     sustained trauma to right eye  . Dizziness     occasional  . Hypertension   . Carotid artery occlusion 03/2011    occluded left ICA. Near occlusive disease in R ICA. s/p  CEA  . Stroke 03/2011  . Pulmonary hypertension     moderate  . Atrial fibrillation     normal EF  . COPD (chronic obstructive pulmonary disease)   . Chronic diastolic congestive heart failure     Past Surgical History  Procedure Date  . Left breast mastectomy     breast cancer  . Resection of bowel     many years ago,small bowel obstruction  . Abdominal hysterectomy   . Appendectomy   . Left eye cataract surgery   . Carotid endarterectomy 03/2011    right    ROS:  As stated in the HPI and negative for all other systems.  PHYSICAL EXAM BP 147/71  Pulse 61  Resp 18  Ht 5' (1.524 m)  Wt 186 lb (84.369 kg)  BMI 36.33 kg/m2 GEN:  No distress NECK:  No jugular venous distention at 90 degrees, waveform within normal limits, carotid upstroke brisk and symmetric, no bruits, no thyromegaly LUNGS:  Clear to auscultation bilaterally BACK:  No CVA tenderness CHEST:  Left mastectomy HEART:  S1 and S2 within  normal limits, no S3,  no clicks, no rubs, no murmurs, irregular ABD:  Positive bowel sounds normal in frequency in pitch, no bruits, no rebound, no guarding, unable to assess midline mass or bruit with the patient seated. EXT:  2 plus pulses upper, diminished bilateral lower, moderate left leg greater than right edema, no cyanosis no clubbing SKIN:  No rashes no nodules NEURO:  Cranial nerves II through XII grossly intact, motor grossly intact throughout PSYCH:  Cognitively intact, oriented to person place and time   ASSESSMENT AND PLAN

## 2012-03-25 NOTE — Patient Instructions (Signed)
Your physician recommends that you schedule a follow-up appointment in: 1 year. You will receive a reminder letter in the mail in about 10 months reminding you to call and schedule your appointment. If you don't receive this letter, please contact our office.   Your physician has recommended you make the following change in your medication: stop aspirin. All other medications remain the same.

## 2012-03-25 NOTE — Assessment & Plan Note (Signed)
Her blood pressure is very mildly elevated. However, I reviewed previous readings and it somewhat labile so I will not make further adjustments.

## 2012-03-25 NOTE — Assessment & Plan Note (Signed)
She seems to be euvolemic.  At this point, no change in therapy is indicated.  We have reviewed salt and fluid restrictions.  No further cardiovascular testing is indicated.   

## 2012-03-25 NOTE — Assessment & Plan Note (Signed)
The patient  tolerates this rhythm and rate control and anticoagulation. We will continue with the meds as listed.  She's having no problems with warfarin and does not want to consider changing to other therapies.

## 2012-03-25 NOTE — Assessment & Plan Note (Signed)
She has this and her lower extremity vascular disease followed by her primary provider.

## 2012-04-11 ENCOUNTER — Ambulatory Visit (INDEPENDENT_AMBULATORY_CARE_PROVIDER_SITE_OTHER): Payer: Medicare Other | Admitting: *Deleted

## 2012-04-11 DIAGNOSIS — I4891 Unspecified atrial fibrillation: Secondary | ICD-10-CM

## 2012-04-11 DIAGNOSIS — Z7901 Long term (current) use of anticoagulants: Secondary | ICD-10-CM

## 2012-04-11 DIAGNOSIS — I6350 Cerebral infarction due to unspecified occlusion or stenosis of unspecified cerebral artery: Secondary | ICD-10-CM

## 2012-04-29 ENCOUNTER — Ambulatory Visit (INDEPENDENT_AMBULATORY_CARE_PROVIDER_SITE_OTHER): Payer: Medicare Other | Admitting: *Deleted

## 2012-04-29 DIAGNOSIS — Z7901 Long term (current) use of anticoagulants: Secondary | ICD-10-CM

## 2012-04-29 DIAGNOSIS — I6350 Cerebral infarction due to unspecified occlusion or stenosis of unspecified cerebral artery: Secondary | ICD-10-CM

## 2012-04-29 DIAGNOSIS — I4891 Unspecified atrial fibrillation: Secondary | ICD-10-CM

## 2012-04-29 LAB — POCT INR: INR: 2.8

## 2012-05-06 ENCOUNTER — Other Ambulatory Visit: Payer: Self-pay | Admitting: Cardiovascular Disease

## 2012-05-16 ENCOUNTER — Ambulatory Visit (INDEPENDENT_AMBULATORY_CARE_PROVIDER_SITE_OTHER): Payer: Medicare Other | Admitting: *Deleted

## 2012-05-16 DIAGNOSIS — Z7901 Long term (current) use of anticoagulants: Secondary | ICD-10-CM

## 2012-05-16 DIAGNOSIS — I4891 Unspecified atrial fibrillation: Secondary | ICD-10-CM

## 2012-05-16 DIAGNOSIS — I6350 Cerebral infarction due to unspecified occlusion or stenosis of unspecified cerebral artery: Secondary | ICD-10-CM

## 2012-06-13 ENCOUNTER — Ambulatory Visit (INDEPENDENT_AMBULATORY_CARE_PROVIDER_SITE_OTHER): Payer: Medicare Other | Admitting: *Deleted

## 2012-06-13 DIAGNOSIS — Z7901 Long term (current) use of anticoagulants: Secondary | ICD-10-CM

## 2012-06-13 DIAGNOSIS — I4891 Unspecified atrial fibrillation: Secondary | ICD-10-CM

## 2012-06-13 DIAGNOSIS — I6350 Cerebral infarction due to unspecified occlusion or stenosis of unspecified cerebral artery: Secondary | ICD-10-CM

## 2012-06-27 ENCOUNTER — Ambulatory Visit (INDEPENDENT_AMBULATORY_CARE_PROVIDER_SITE_OTHER): Payer: Medicare Other | Admitting: *Deleted

## 2012-06-27 DIAGNOSIS — Z7901 Long term (current) use of anticoagulants: Secondary | ICD-10-CM

## 2012-06-27 DIAGNOSIS — I6350 Cerebral infarction due to unspecified occlusion or stenosis of unspecified cerebral artery: Secondary | ICD-10-CM

## 2012-06-27 DIAGNOSIS — I4891 Unspecified atrial fibrillation: Secondary | ICD-10-CM

## 2012-07-11 ENCOUNTER — Other Ambulatory Visit: Payer: Self-pay | Admitting: Cardiovascular Disease

## 2012-07-17 ENCOUNTER — Ambulatory Visit: Payer: Medicare Other | Admitting: Neurosurgery

## 2012-07-17 ENCOUNTER — Other Ambulatory Visit: Payer: Medicare Other

## 2012-07-18 ENCOUNTER — Ambulatory Visit (INDEPENDENT_AMBULATORY_CARE_PROVIDER_SITE_OTHER): Payer: Medicare Other | Admitting: *Deleted

## 2012-07-18 DIAGNOSIS — Z7901 Long term (current) use of anticoagulants: Secondary | ICD-10-CM

## 2012-07-18 DIAGNOSIS — I4891 Unspecified atrial fibrillation: Secondary | ICD-10-CM

## 2012-07-18 DIAGNOSIS — I6350 Cerebral infarction due to unspecified occlusion or stenosis of unspecified cerebral artery: Secondary | ICD-10-CM

## 2012-07-18 NOTE — Progress Notes (Signed)
I can not close 

## 2012-07-28 ENCOUNTER — Other Ambulatory Visit: Payer: Self-pay | Admitting: *Deleted

## 2012-07-28 DIAGNOSIS — Z48812 Encounter for surgical aftercare following surgery on the circulatory system: Secondary | ICD-10-CM

## 2012-07-28 DIAGNOSIS — I6529 Occlusion and stenosis of unspecified carotid artery: Secondary | ICD-10-CM

## 2012-07-30 ENCOUNTER — Encounter: Payer: Self-pay | Admitting: Neurosurgery

## 2012-07-31 ENCOUNTER — Other Ambulatory Visit (INDEPENDENT_AMBULATORY_CARE_PROVIDER_SITE_OTHER): Payer: Medicare Other | Admitting: *Deleted

## 2012-07-31 ENCOUNTER — Ambulatory Visit (INDEPENDENT_AMBULATORY_CARE_PROVIDER_SITE_OTHER): Payer: Medicare Other | Admitting: Neurosurgery

## 2012-07-31 ENCOUNTER — Encounter: Payer: Self-pay | Admitting: Neurosurgery

## 2012-07-31 VITALS — BP 176/86 | HR 54 | Resp 18 | Ht 60.0 in | Wt 185.0 lb

## 2012-07-31 DIAGNOSIS — I6529 Occlusion and stenosis of unspecified carotid artery: Secondary | ICD-10-CM

## 2012-07-31 DIAGNOSIS — I739 Peripheral vascular disease, unspecified: Secondary | ICD-10-CM

## 2012-07-31 DIAGNOSIS — Z48812 Encounter for surgical aftercare following surgery on the circulatory system: Secondary | ICD-10-CM

## 2012-07-31 NOTE — Progress Notes (Signed)
[facility] [place] Carotid Office Note  CC: Carotid surveillance Referring Physician: Fields  History of Present Illness: 78 year old female patient of Dr. Darrick Penna status post right CEA in July 2012. The patient denies any signs or symptoms of CVA, TIA, amaurosis fugax or any neural deficit. The patient is seen with her son who denies any new medical diagnoses or recent surgery.  Past Medical History  Diagnosis Date  . Breast cancer, left breast     s/p left mastectomy  . Leg wound, left   . Blindness of right eye     sustained trauma to right eye  . Dizziness     occasional  . Hypertension   . Carotid artery occlusion 03/2011    occluded left ICA. Near occlusive disease in R ICA. s/p CEA  . Stroke 03/2011  . Pulmonary hypertension     moderate  . Atrial fibrillation     normal EF  . COPD (chronic obstructive pulmonary disease)   . Chronic diastolic congestive heart failure     ROS: [x]  Positive   [ ]  Denies    General: [ ]  Weight loss, [ ]  Fever, [ ]  chills Neurologic: [ ]  Dizziness, [ ]  Blackouts, [ ]  Seizure [ ]  Stroke, [ ]  "Mini stroke", [ ]  Slurred speech, [ ]  Temporary blindness; [ ]  weakness in arms or legs, [ ]  Hoarseness Cardiac: [ ]  Chest pain/pressure, [ ]  Shortness of breath at rest [ ]  Shortness of breath with exertion, [ ]  Atrial fibrillation or irregular heartbeat Vascular: [ ]  Pain in legs with walking, [ ]  Pain in legs at rest, [ ]  Pain in legs at night,  [ ]  Non-healing ulcer, [ ]  Blood clot in vein/DVT,   Pulmonary: [ ]  Home oxygen, [ ]  Productive cough, [ ]  Coughing up blood, [ ]  Asthma,  [ ]  Wheezing Musculoskeletal:  [ ]  Arthritis, [ ]  Low back pain, [ ]  Joint pain Hematologic: [ ]  Easy Bruising, [ ]  Anemia; [ ]  Hepatitis Gastrointestinal: [ ]  Blood in stool, [ ]  Gastroesophageal Reflux/heartburn, [ ]  Trouble swallowing Urinary: [ ]  chronic Kidney disease, [ ]  on HD - [ ]  MWF or [ ]  TTHS, [ ]  Burning with urination, [ ]  Difficulty  urinating Skin: [ ]  Rashes, [ ]  Wounds Psychological: [ ]  Anxiety, [ ]  Depression   Social History History  Substance Use Topics  . Smoking status: Former Smoker -- 0.5 packs/day for 35 years    Types: Cigarettes    Quit date: 10/22/1976  . Smokeless tobacco: Never Used  . Alcohol Use: No    Family History Family History  Problem Relation Age of Onset  . Other      Negative for premature coronary artery disease or arrhythmia    Allergies  Allergen Reactions  . Ciprofloxacin   . Other     FLUOROQUINOLONE    Current Outpatient Prescriptions  Medication Sig Dispense Refill  . benzonatate (TESSALON) 100 MG capsule Take 100 mg by mouth 3 (three) times daily as needed.      . Calcium Carbonate-Vitamin D (CALCIUM + D PO) Take 1 tablet by mouth 2 (two) times daily.       . cholecalciferol (VITAMIN D) 1000 UNITS tablet Take 1,000 Units by mouth daily.      . furosemide (LASIX) 20 MG tablet TAKE ONE TABLET ONCE DAILY  30 tablet  9  . guaiFENesin-codeine (ROBITUSSIN AC) 100-10 MG/5ML syrup Take 10 mLs by mouth every 4 (four) hours  as needed.      . metoprolol (LOPRESSOR) 50 MG tablet TAKE ONE TABLET TWICE DAILY  60 tablet  9  . Multiple Vitamin (MULTIVITAMIN) tablet Take 1 tablet by mouth daily.        . Nutritional Supplements (RESOURCE ARGINAID EXTRA PO) Take 1 packet by mouth 2 (two) times daily.      . vitamin C (ASCORBIC ACID) 500 MG tablet Take 500 mg by mouth daily.      Marland Kitchen warfarin (COUMADIN) 2 MG tablet TAKE 2 TABLETS ONCE DAILY OR AS DIRECTED  60 tablet  3    Physical Examination  Filed Vitals:   07/31/12 1523  BP: 176/86  Pulse: 54  Resp: 18    Body mass index is 36.13 kg/(m^2).  General:  WDWN in NAD Gait: Normal HEENT: WNL Eyes: Pupils equal Pulmonary: normal non-labored breathing , without Rales, rhonchi,  wheezing Cardiac: RRR, without  Murmurs, rubs or gallops; Abdomen: soft, NT, no masses Skin: no rashes, ulcers noted  Vascular Exam Pulses: 2+  radial pulses bilaterally Carotid bruits: Carotid pulses are in the right with a known left occlusion Extremities without ischemic changes, no Gangrene , no cellulitis; no open wounds;  Musculoskeletal: no muscle wasting or atrophy   Neurologic: A&O X 3; Appropriate Affect ; SENSATION: normal; MOTOR FUNCTION:  moving all extremities equally. Speech is fluent/normal  Non-Invasive Vascular Imaging CAROTID DUPLEX 07/31/2012  Right ICA 0 - 19% stenosis Left ICA 0ccluded stenosis   ASSESSMENT/PLAN: Asymptomatic patient 15 months status post right CEA and doing well. The patient return in one year for repeat carotid duplex. The patient's questions were encouraged and answered.  Lauree Chandler ANP   Clinic MD: Darrick Penna

## 2012-08-08 ENCOUNTER — Ambulatory Visit (INDEPENDENT_AMBULATORY_CARE_PROVIDER_SITE_OTHER): Payer: Medicare Other | Admitting: *Deleted

## 2012-08-08 DIAGNOSIS — I4891 Unspecified atrial fibrillation: Secondary | ICD-10-CM

## 2012-08-08 DIAGNOSIS — I6350 Cerebral infarction due to unspecified occlusion or stenosis of unspecified cerebral artery: Secondary | ICD-10-CM

## 2012-08-08 DIAGNOSIS — Z7901 Long term (current) use of anticoagulants: Secondary | ICD-10-CM

## 2012-09-05 ENCOUNTER — Ambulatory Visit (INDEPENDENT_AMBULATORY_CARE_PROVIDER_SITE_OTHER): Payer: Medicare Other | Admitting: *Deleted

## 2012-09-05 DIAGNOSIS — I4891 Unspecified atrial fibrillation: Secondary | ICD-10-CM

## 2012-09-05 DIAGNOSIS — Z7901 Long term (current) use of anticoagulants: Secondary | ICD-10-CM

## 2012-09-05 DIAGNOSIS — I6350 Cerebral infarction due to unspecified occlusion or stenosis of unspecified cerebral artery: Secondary | ICD-10-CM

## 2012-09-22 ENCOUNTER — Other Ambulatory Visit: Payer: Self-pay | Admitting: *Deleted

## 2012-09-22 DIAGNOSIS — Z48812 Encounter for surgical aftercare following surgery on the circulatory system: Secondary | ICD-10-CM

## 2012-09-22 DIAGNOSIS — I6529 Occlusion and stenosis of unspecified carotid artery: Secondary | ICD-10-CM

## 2012-09-26 ENCOUNTER — Ambulatory Visit (INDEPENDENT_AMBULATORY_CARE_PROVIDER_SITE_OTHER): Payer: Medicare Other | Admitting: *Deleted

## 2012-09-26 DIAGNOSIS — Z7901 Long term (current) use of anticoagulants: Secondary | ICD-10-CM

## 2012-09-26 DIAGNOSIS — I6350 Cerebral infarction due to unspecified occlusion or stenosis of unspecified cerebral artery: Secondary | ICD-10-CM

## 2012-09-26 DIAGNOSIS — I4891 Unspecified atrial fibrillation: Secondary | ICD-10-CM

## 2012-10-24 ENCOUNTER — Ambulatory Visit (INDEPENDENT_AMBULATORY_CARE_PROVIDER_SITE_OTHER): Payer: Medicare Other | Admitting: *Deleted

## 2012-10-24 DIAGNOSIS — I6350 Cerebral infarction due to unspecified occlusion or stenosis of unspecified cerebral artery: Secondary | ICD-10-CM

## 2012-10-24 DIAGNOSIS — Z7901 Long term (current) use of anticoagulants: Secondary | ICD-10-CM

## 2012-10-24 DIAGNOSIS — I4891 Unspecified atrial fibrillation: Secondary | ICD-10-CM

## 2012-11-03 ENCOUNTER — Other Ambulatory Visit: Payer: Self-pay | Admitting: Physician Assistant

## 2012-11-21 ENCOUNTER — Ambulatory Visit (INDEPENDENT_AMBULATORY_CARE_PROVIDER_SITE_OTHER): Payer: Medicare Other | Admitting: *Deleted

## 2012-11-21 DIAGNOSIS — Z7901 Long term (current) use of anticoagulants: Secondary | ICD-10-CM

## 2012-11-21 DIAGNOSIS — I6350 Cerebral infarction due to unspecified occlusion or stenosis of unspecified cerebral artery: Secondary | ICD-10-CM

## 2012-11-21 DIAGNOSIS — I4891 Unspecified atrial fibrillation: Secondary | ICD-10-CM

## 2012-11-21 LAB — POCT INR: INR: 3.1

## 2012-12-19 ENCOUNTER — Ambulatory Visit (INDEPENDENT_AMBULATORY_CARE_PROVIDER_SITE_OTHER): Payer: Medicare Other | Admitting: *Deleted

## 2012-12-19 DIAGNOSIS — I4891 Unspecified atrial fibrillation: Secondary | ICD-10-CM

## 2012-12-19 LAB — POCT INR: INR: 2.1

## 2013-01-07 ENCOUNTER — Other Ambulatory Visit: Payer: Self-pay | Admitting: Physician Assistant

## 2013-01-16 ENCOUNTER — Ambulatory Visit (INDEPENDENT_AMBULATORY_CARE_PROVIDER_SITE_OTHER): Payer: Medicare Other | Admitting: *Deleted

## 2013-01-16 DIAGNOSIS — I4891 Unspecified atrial fibrillation: Secondary | ICD-10-CM

## 2013-01-16 DIAGNOSIS — I6350 Cerebral infarction due to unspecified occlusion or stenosis of unspecified cerebral artery: Secondary | ICD-10-CM

## 2013-01-16 DIAGNOSIS — Z7901 Long term (current) use of anticoagulants: Secondary | ICD-10-CM

## 2013-01-16 LAB — POCT INR: INR: 2.4

## 2013-01-16 MED ORDER — WARFARIN SODIUM 2 MG PO TABS: ORAL_TABLET | ORAL | Status: DC

## 2013-02-20 ENCOUNTER — Ambulatory Visit (INDEPENDENT_AMBULATORY_CARE_PROVIDER_SITE_OTHER): Payer: Medicare Other | Admitting: *Deleted

## 2013-02-20 DIAGNOSIS — Z7901 Long term (current) use of anticoagulants: Secondary | ICD-10-CM

## 2013-02-20 DIAGNOSIS — I4891 Unspecified atrial fibrillation: Secondary | ICD-10-CM

## 2013-02-20 DIAGNOSIS — I6350 Cerebral infarction due to unspecified occlusion or stenosis of unspecified cerebral artery: Secondary | ICD-10-CM

## 2013-02-20 LAB — POCT INR: INR: 1.6

## 2013-03-06 ENCOUNTER — Ambulatory Visit (INDEPENDENT_AMBULATORY_CARE_PROVIDER_SITE_OTHER): Payer: Medicare Other | Admitting: *Deleted

## 2013-03-06 DIAGNOSIS — I4891 Unspecified atrial fibrillation: Secondary | ICD-10-CM

## 2013-03-06 DIAGNOSIS — Z7901 Long term (current) use of anticoagulants: Secondary | ICD-10-CM

## 2013-03-06 DIAGNOSIS — I6350 Cerebral infarction due to unspecified occlusion or stenosis of unspecified cerebral artery: Secondary | ICD-10-CM

## 2013-03-06 LAB — POCT INR: INR: 1.5

## 2013-03-20 ENCOUNTER — Ambulatory Visit (INDEPENDENT_AMBULATORY_CARE_PROVIDER_SITE_OTHER): Payer: Medicare Other | Admitting: *Deleted

## 2013-03-20 DIAGNOSIS — Z7901 Long term (current) use of anticoagulants: Secondary | ICD-10-CM

## 2013-03-20 DIAGNOSIS — I6350 Cerebral infarction due to unspecified occlusion or stenosis of unspecified cerebral artery: Secondary | ICD-10-CM

## 2013-03-20 DIAGNOSIS — I4891 Unspecified atrial fibrillation: Secondary | ICD-10-CM

## 2013-03-20 LAB — POCT INR: INR: 2.4

## 2013-03-30 ENCOUNTER — Encounter: Payer: Self-pay | Admitting: Cardiology

## 2013-03-30 ENCOUNTER — Other Ambulatory Visit: Payer: Self-pay | Admitting: *Deleted

## 2013-03-30 ENCOUNTER — Ambulatory Visit (INDEPENDENT_AMBULATORY_CARE_PROVIDER_SITE_OTHER): Payer: Medicare Other | Admitting: Cardiology

## 2013-03-30 VITALS — BP 195/76 | HR 53 | Ht 60.0 in | Wt 185.0 lb

## 2013-03-30 DIAGNOSIS — I4891 Unspecified atrial fibrillation: Secondary | ICD-10-CM

## 2013-03-30 DIAGNOSIS — I10 Essential (primary) hypertension: Secondary | ICD-10-CM

## 2013-03-30 MED ORDER — LISINOPRIL 2.5 MG PO TABS: 2.5000 mg | ORAL_TABLET | Freq: Every day | ORAL | Status: DC

## 2013-03-30 NOTE — Patient Instructions (Addendum)
Your physician recommends that you schedule a follow-up appointment in: 1 year. You will receive a reminder letter in the mail in about 10 months reminding you to call and schedule your appointment. If you don't receive this letter, please contact our office. Your physician has recommended you make the following change in your medication: Start lisinopril 2.5 mg daily. Your new prescription has been sent to your pharmacy. All other medications will remain the same. Your physician recommends that you return for lab work in: 2 weeks around April 13, 2013 at [facility] for [employer]. Please follow up with Dr. Loney Hering about your blood pressure.

## 2013-03-30 NOTE — Progress Notes (Signed)
HPI The patient presents for follow up of atrial fib.   Since I last saw her she has had no new cardiovascular problems.  She rarely notices palpitations..  She denies any presyncope or syncope. She has had no chest pressure, neck or arm discomfort. She has had no new shortness of breath, PND or orthopnea. She denies any weight gain. She gets around her house with her walker.  She does have chronic lower extremity edema. This is unchanged. She does do her own chores of daily living without significant limitations.  Allergies  Allergen Reactions  . Ciprofloxacin   . Other     FLUOROQUINOLONE    Current Outpatient Prescriptions  Medication Sig Dispense Refill  . Calcium Carbonate-Vitamin D (CALCIUM + D PO) Take 1 tablet by mouth 2 (two) times daily.       . cholecalciferol (VITAMIN D) 1000 UNITS tablet Take 1,000 Units by mouth daily.      . furosemide (LASIX) 20 MG tablet TAKE ONE TABLET ONCE DAILY  30 tablet  9  . metoprolol (LOPRESSOR) 50 MG tablet TAKE ONE TABLET TWICE DAILY  60 tablet  9  . Multiple Vitamin (MULTIVITAMIN) tablet Take 1 tablet by mouth daily.        . vitamin C (ASCORBIC ACID) 500 MG tablet Take 500 mg by mouth daily.      Marland Kitchen warfarin (COUMADIN) 2 MG tablet Take coumadin 2 tablets daily  60 tablet  3   No current facility-administered medications for this visit.    Past Medical History  Diagnosis Date  . Breast cancer, left breast     s/p left mastectomy  . Leg wound, left   . Blindness of right eye     sustained trauma to right eye  . Dizziness     occasional  . Hypertension   . Carotid artery occlusion 03/2011    occluded left ICA. Near occlusive disease in R ICA. s/p CEA  . Stroke 03/2011  . Pulmonary hypertension     moderate  . Atrial fibrillation     normal EF  . COPD (chronic obstructive pulmonary disease)   . Chronic diastolic congestive heart failure     Past Surgical History  Procedure Laterality Date  . Left breast mastectomy      breast  cancer  . Resection of bowel      many years ago,small bowel obstruction  . Abdominal hysterectomy    . Appendectomy    . Left eye cataract surgery    . Carotid endarterectomy  03/2011    right    ROS:  As stated in the HPI and negative for all other systems.  PHYSICAL EXAM BP 195/76  Pulse 85  Ht 5' (1.524 m)  Wt 185 lb (83.915 kg)  BMI 36.13 kg/m2 GEN:  No distress NECK:  No jugular venous distention at 90 degrees, waveform within normal limits, carotid upstroke brisk and symmetric, no bruits, no thyromegaly LUNGS:  Clear to auscultation bilaterally BACK:  No CVA tenderness CHEST:  Left mastectomy HEART:  S1 and S2 within normal limits, no S3,  no clicks, no rubs, no murmurs, irregular ABD:  Positive bowel sounds normal in frequency in pitch, no bruits, no rebound, no guarding, unable to assess midline mass or bruit with the patient seated. EXT:  2 plus pulses upper, diminished bilateral lower, moderate left leg greater than right edema, no cyanosis no clubbing SKIN:  No rashes no nodules NEURO:  Cranial nerves II through XII grossly  intact, motor grossly intact throughout PSYCH:  Cognitively intact, oriented to person place and time  EKG:  Atrial fibrillation, rate 53, rightward axis, no acute ST-T wave changes.  ASSESSMENT AND PLAN  ATRIAL FIBRILLATION:  She tolerates anticoagulation and has an obvious slow rate. She doesn't feel this. No change in therapy is indicated.  HTN:  Her blood pressure has been elevated repeatedly. She's had near normal renal function on the last basic metabolic profile that I can see. I'm going to choose to start lisinopril 2.5 mg daily with follow her renal function closely. I will suggest followup blood pressures with Dr. Loney Hering.  DIASTOLIC HF:  She has no symptoms related to this and aside from lower extremity edema no evidence of volume overload.  She will remain on the meds as listed.  CAROTID STENOSIS:  She follows with [facility] for management  of this.

## 2013-04-10 ENCOUNTER — Ambulatory Visit (INDEPENDENT_AMBULATORY_CARE_PROVIDER_SITE_OTHER): Payer: Medicare Other | Admitting: *Deleted

## 2013-04-10 DIAGNOSIS — I4891 Unspecified atrial fibrillation: Secondary | ICD-10-CM

## 2013-04-10 DIAGNOSIS — Z7901 Long term (current) use of anticoagulants: Secondary | ICD-10-CM

## 2013-04-10 DIAGNOSIS — I6350 Cerebral infarction due to unspecified occlusion or stenosis of unspecified cerebral artery: Secondary | ICD-10-CM

## 2013-04-17 ENCOUNTER — Telehealth: Payer: Self-pay | Admitting: Cardiology

## 2013-04-17 NOTE — Telephone Encounter (Signed)
Message copied by Burnice Logan on Fri Apr 17, 2013  4:10 PM ------      Message from: Meredeth Ide, PAMELA J      Created: Fri Apr 17, 2013 12:39 PM                   ----- Message -----         From: Rollene Rotunda, MD         Sent: 04/14/2013   1:29 PM           To: Rocco Serene, RN            Labs OK.  Call Ms. Porath with the results and send results to Ernestine Conrad, MD       ------

## 2013-04-17 NOTE — Telephone Encounter (Signed)
Pt informed of results.

## 2013-05-01 ENCOUNTER — Ambulatory Visit (INDEPENDENT_AMBULATORY_CARE_PROVIDER_SITE_OTHER): Payer: Medicare Other | Admitting: *Deleted

## 2013-05-01 DIAGNOSIS — Z7901 Long term (current) use of anticoagulants: Secondary | ICD-10-CM

## 2013-05-01 DIAGNOSIS — I6350 Cerebral infarction due to unspecified occlusion or stenosis of unspecified cerebral artery: Secondary | ICD-10-CM

## 2013-05-01 DIAGNOSIS — I4891 Unspecified atrial fibrillation: Secondary | ICD-10-CM

## 2013-05-01 LAB — POCT INR: INR: 2.2

## 2013-05-22 ENCOUNTER — Ambulatory Visit (INDEPENDENT_AMBULATORY_CARE_PROVIDER_SITE_OTHER): Payer: Medicare Other | Admitting: *Deleted

## 2013-05-22 DIAGNOSIS — Z7901 Long term (current) use of anticoagulants: Secondary | ICD-10-CM

## 2013-05-22 DIAGNOSIS — I4891 Unspecified atrial fibrillation: Secondary | ICD-10-CM

## 2013-05-22 DIAGNOSIS — I6350 Cerebral infarction due to unspecified occlusion or stenosis of unspecified cerebral artery: Secondary | ICD-10-CM

## 2013-06-01 ENCOUNTER — Other Ambulatory Visit: Payer: Self-pay | Admitting: Physician Assistant

## 2013-06-19 ENCOUNTER — Ambulatory Visit (INDEPENDENT_AMBULATORY_CARE_PROVIDER_SITE_OTHER): Payer: Medicare Other | Admitting: *Deleted

## 2013-06-19 DIAGNOSIS — Z7901 Long term (current) use of anticoagulants: Secondary | ICD-10-CM

## 2013-06-19 DIAGNOSIS — I4891 Unspecified atrial fibrillation: Secondary | ICD-10-CM

## 2013-06-19 DIAGNOSIS — I6350 Cerebral infarction due to unspecified occlusion or stenosis of unspecified cerebral artery: Secondary | ICD-10-CM

## 2013-07-31 ENCOUNTER — Ambulatory Visit (INDEPENDENT_AMBULATORY_CARE_PROVIDER_SITE_OTHER): Payer: Medicare Other | Admitting: *Deleted

## 2013-07-31 ENCOUNTER — Ambulatory Visit: Payer: Medicare Other | Admitting: Family

## 2013-07-31 ENCOUNTER — Other Ambulatory Visit: Payer: Medicare Other

## 2013-07-31 DIAGNOSIS — I4891 Unspecified atrial fibrillation: Secondary | ICD-10-CM

## 2013-07-31 DIAGNOSIS — Z7901 Long term (current) use of anticoagulants: Secondary | ICD-10-CM

## 2013-07-31 DIAGNOSIS — I6350 Cerebral infarction due to unspecified occlusion or stenosis of unspecified cerebral artery: Secondary | ICD-10-CM

## 2013-09-04 ENCOUNTER — Encounter: Payer: Self-pay | Admitting: Cardiovascular Disease

## 2013-09-11 ENCOUNTER — Ambulatory Visit (INDEPENDENT_AMBULATORY_CARE_PROVIDER_SITE_OTHER): Payer: Medicare Other | Admitting: *Deleted

## 2013-09-11 DIAGNOSIS — I6350 Cerebral infarction due to unspecified occlusion or stenosis of unspecified cerebral artery: Secondary | ICD-10-CM

## 2013-09-11 DIAGNOSIS — I4891 Unspecified atrial fibrillation: Secondary | ICD-10-CM

## 2013-09-11 DIAGNOSIS — Z7901 Long term (current) use of anticoagulants: Secondary | ICD-10-CM

## 2013-09-11 LAB — POCT INR: INR: 5.6

## 2013-09-15 ENCOUNTER — Ambulatory Visit (INDEPENDENT_AMBULATORY_CARE_PROVIDER_SITE_OTHER): Payer: Medicare Other | Admitting: *Deleted

## 2013-09-15 DIAGNOSIS — I6350 Cerebral infarction due to unspecified occlusion or stenosis of unspecified cerebral artery: Secondary | ICD-10-CM

## 2013-09-15 DIAGNOSIS — Z7901 Long term (current) use of anticoagulants: Secondary | ICD-10-CM

## 2013-09-15 DIAGNOSIS — I4891 Unspecified atrial fibrillation: Secondary | ICD-10-CM

## 2013-09-15 LAB — POCT INR: INR: 1.5

## 2013-09-26 ENCOUNTER — Other Ambulatory Visit: Payer: Self-pay | Admitting: Cardiology

## 2013-09-28 NOTE — Telephone Encounter (Signed)
Patient son came to office requesting warfarin script call in to [facility].  Please send in.

## 2013-10-06 ENCOUNTER — Ambulatory Visit (INDEPENDENT_AMBULATORY_CARE_PROVIDER_SITE_OTHER): Payer: Medicare Other | Admitting: *Deleted

## 2013-10-06 DIAGNOSIS — I4891 Unspecified atrial fibrillation: Secondary | ICD-10-CM

## 2013-10-06 DIAGNOSIS — Z7901 Long term (current) use of anticoagulants: Secondary | ICD-10-CM

## 2013-10-06 DIAGNOSIS — I6350 Cerebral infarction due to unspecified occlusion or stenosis of unspecified cerebral artery: Secondary | ICD-10-CM

## 2013-10-23 ENCOUNTER — Other Ambulatory Visit: Payer: Self-pay | Admitting: Cardiology

## 2013-10-23 MED ORDER — WARFARIN SODIUM 2 MG PO TABS: ORAL_TABLET | ORAL | Status: DC

## 2013-11-03 ENCOUNTER — Ambulatory Visit (INDEPENDENT_AMBULATORY_CARE_PROVIDER_SITE_OTHER): Payer: Medicare Other | Admitting: *Deleted

## 2013-11-03 DIAGNOSIS — Z7901 Long term (current) use of anticoagulants: Secondary | ICD-10-CM

## 2013-11-03 DIAGNOSIS — I4891 Unspecified atrial fibrillation: Secondary | ICD-10-CM

## 2013-11-03 DIAGNOSIS — I6350 Cerebral infarction due to unspecified occlusion or stenosis of unspecified cerebral artery: Secondary | ICD-10-CM

## 2013-11-03 LAB — POCT INR: INR: 2.1

## 2013-12-01 ENCOUNTER — Ambulatory Visit (INDEPENDENT_AMBULATORY_CARE_PROVIDER_SITE_OTHER): Payer: Medicare Other | Admitting: *Deleted

## 2013-12-01 DIAGNOSIS — Z7901 Long term (current) use of anticoagulants: Secondary | ICD-10-CM

## 2013-12-01 DIAGNOSIS — Z5181 Encounter for therapeutic drug level monitoring: Secondary | ICD-10-CM

## 2013-12-01 DIAGNOSIS — I6350 Cerebral infarction due to unspecified occlusion or stenosis of unspecified cerebral artery: Secondary | ICD-10-CM

## 2013-12-01 DIAGNOSIS — I4891 Unspecified atrial fibrillation: Secondary | ICD-10-CM

## 2013-12-01 LAB — POCT INR: INR: 2.2

## 2014-01-12 ENCOUNTER — Ambulatory Visit (INDEPENDENT_AMBULATORY_CARE_PROVIDER_SITE_OTHER): Payer: Medicare Other | Admitting: *Deleted

## 2014-01-12 DIAGNOSIS — Z5181 Encounter for therapeutic drug level monitoring: Secondary | ICD-10-CM

## 2014-01-12 DIAGNOSIS — Z7901 Long term (current) use of anticoagulants: Secondary | ICD-10-CM

## 2014-01-12 DIAGNOSIS — I6350 Cerebral infarction due to unspecified occlusion or stenosis of unspecified cerebral artery: Secondary | ICD-10-CM

## 2014-01-12 DIAGNOSIS — I4891 Unspecified atrial fibrillation: Secondary | ICD-10-CM

## 2014-01-12 LAB — POCT INR: INR: 2.4

## 2014-02-18 ENCOUNTER — Telehealth: Payer: Self-pay | Admitting: Cardiology

## 2014-02-18 MED ORDER — WARFARIN SODIUM 2 MG PO TABS: ORAL_TABLET | ORAL | Status: DC

## 2014-02-18 NOTE — Telephone Encounter (Signed)
warfarin (COUMADIN) 2 MG tablet  Has two days left [facility]

## 2014-02-23 ENCOUNTER — Ambulatory Visit (INDEPENDENT_AMBULATORY_CARE_PROVIDER_SITE_OTHER): Payer: Medicare Other | Admitting: *Deleted

## 2014-02-23 DIAGNOSIS — Z7901 Long term (current) use of anticoagulants: Secondary | ICD-10-CM

## 2014-02-23 DIAGNOSIS — Z5181 Encounter for therapeutic drug level monitoring: Secondary | ICD-10-CM

## 2014-02-23 DIAGNOSIS — I6350 Cerebral infarction due to unspecified occlusion or stenosis of unspecified cerebral artery: Secondary | ICD-10-CM

## 2014-02-23 DIAGNOSIS — I4891 Unspecified atrial fibrillation: Secondary | ICD-10-CM

## 2014-02-23 LAB — POCT INR: INR: 2.1

## 2014-04-02 ENCOUNTER — Encounter: Payer: Self-pay | Admitting: Cardiovascular Disease

## 2014-04-02 ENCOUNTER — Ambulatory Visit (INDEPENDENT_AMBULATORY_CARE_PROVIDER_SITE_OTHER): Payer: Medicare Other | Admitting: Cardiovascular Disease

## 2014-04-02 VITALS — BP 175/69 | HR 44 | Ht 60.0 in | Wt 186.0 lb

## 2014-04-02 DIAGNOSIS — I6350 Cerebral infarction due to unspecified occlusion or stenosis of unspecified cerebral artery: Secondary | ICD-10-CM

## 2014-04-02 DIAGNOSIS — I4891 Unspecified atrial fibrillation: Secondary | ICD-10-CM

## 2014-04-02 DIAGNOSIS — I779 Disorder of arteries and arterioles, unspecified: Secondary | ICD-10-CM

## 2014-04-02 DIAGNOSIS — I739 Peripheral vascular disease, unspecified: Secondary | ICD-10-CM

## 2014-04-02 DIAGNOSIS — I10 Essential (primary) hypertension: Secondary | ICD-10-CM

## 2014-04-02 DIAGNOSIS — R9431 Abnormal electrocardiogram [ECG] [EKG]: Secondary | ICD-10-CM

## 2014-04-02 DIAGNOSIS — I5033 Acute on chronic diastolic (congestive) heart failure: Secondary | ICD-10-CM

## 2014-04-02 DIAGNOSIS — Z79899 Other long term (current) drug therapy: Secondary | ICD-10-CM

## 2014-04-02 MED ORDER — LISINOPRIL 10 MG PO TABS: 10.0000 mg | ORAL_TABLET | Freq: Every day | ORAL | Status: DC

## 2014-04-02 MED ORDER — FUROSEMIDE 40 MG PO TABS: 40.0000 mg | ORAL_TABLET | Freq: Every day | ORAL | Status: DC

## 2014-04-02 MED ORDER — METOPROLOL TARTRATE 25 MG PO TABS: 25.0000 mg | ORAL_TABLET | Freq: Two times a day (BID) | ORAL | Status: DC

## 2014-04-02 NOTE — Progress Notes (Signed)
Patient ID: Karina Salazar, female   DOB: 12/04/1923, 80 y.o.   MRN: [id number]      SUBJECTIVE: The patient is an 80 year old woman who is here for followup of atrial fibrillation, hypertension, and chronic diastolic heart failure. She also has a history of carotid artery disease which is followed by vascular surgery. She has chronic lower extremity swelling. ECG in the office today shows sinus bradycardia with peaked T waves. She has been feeling fatigued for some time. She denies chest pain. She says she gets her breath when walking from the living room to the kitchen and finds herself panting.   Allergies  Allergen Reactions  . Ciprofloxacin   . Other     FLUOROQUINOLONE    Current Outpatient Prescriptions  Medication Sig Dispense Refill  . cholecalciferol (VITAMIN D) 1000 UNITS tablet Take 1,000 Units by mouth daily.      . furosemide (LASIX) 20 MG tablet TAKE ONE TABLET ONCE DAILY  30 tablet  9  . lisinopril (PRINIVIL,ZESTRIL) 5 MG tablet Take 5 mg by mouth daily.      . metoprolol (LOPRESSOR) 50 MG tablet TAKE ONE TABLET TWICE DAILY  60 tablet  9  . Multiple Vitamin (MULTIVITAMIN) tablet Take 1 tablet by mouth daily.        Marland Kitchen warfarin (COUMADIN) 2 MG tablet Take 2 tablets everyday except on Wednesday take 3 tablets.  64 tablet  3   No current facility-administered medications for this visit.    Past Medical History  Diagnosis Date  . Breast cancer, left breast     s/p left mastectomy  . Leg wound, left   . Blindness of right eye     sustained trauma to right eye  . Dizziness     occasional  . Hypertension   . Carotid artery occlusion 03/2011    occluded left ICA. Near occlusive disease in R ICA. s/p CEA  . Stroke 03/2011  . Pulmonary hypertension     moderate  . Atrial fibrillation     normal EF  . COPD (chronic obstructive pulmonary disease)   . Chronic diastolic congestive heart failure     Past Surgical History  Procedure Laterality Date  . Left breast  mastectomy      breast cancer  . Resection of bowel      many years ago,small bowel obstruction  . Abdominal hysterectomy    . Appendectomy    . Left eye cataract surgery    . Carotid endarterectomy  03/2011    right    History   Social History  . Marital Status: Divorced    Spouse Name: N/A    Number of Children: N/A  . Years of Education: N/A   Occupational History  . Not on file.   Social History Main Topics  . Smoking status: Former Smoker -- 0.50 packs/day for 35 years    Types: Cigarettes    Quit date: 10/22/1976  . Smokeless tobacco: Never Used  . Alcohol Use: No  . Drug Use: No  . Sexual Activity: Not on file   Other Topics Concern  . Not on file   Social History Narrative   Lives alone and independent in ADL's.   Ambulates with help of cane     Filed Vitals:   04/02/14 1534  BP: 175/69  Height: 5' (1.524 m)  Weight: 186 lb (84.369 kg)   BP 175/69  Pulse 44   PHYSICAL EXAM General: NAD Neck: No JVD, no thyromegaly. Lungs:  Clear to auscultation bilaterally with normal respiratory effort. CV: Nondisplaced PMI.  Bradycardic, regular rhythm, normal S1/S2, no S3/S4, no murmur. 1-2+ pitting pretibial and periankle edema b/l, left>right.   Abdomen: Soft, nontender, no hepatosplenomegaly, no distention.  Neurologic: Alert and oriented x 3.  Psych: Normal affect. Extremities: No clubbing or cyanosis.   ECG: reviewed and available in electronic records.      ASSESSMENT AND PLAN: 1. Atrial fibrillation: She is bradycardic. I will reduce metoprolol to 25 mg bid. Continue warfarin.  2. HTN: Blood pressure is elevated. I will increase lisinopril to 10 mg daily.  3. Acute on chronic diastolic heart failure: Marked leg swelling. Will increase Lasix to 40 mg daily and check BMET next week. Will aim to control BP by increasing lisinopril to 10 mg daily.  4. Carotid artery disease: Followed by [facility].  5. Peaked T waves: Will check BMET to rule out  hyperkalemia.  Dispo: f/u 2 weeks.  Kate Sable, M.D., F.A.C.C.

## 2014-04-02 NOTE — Patient Instructions (Signed)
   Increase Lasix to 40mg  daily (may take 2 tabs of your 20mg  tablet till finish current supply)   Increase Lisinopril to 10mg  daily (may take 2 tabs of your 5mg  tablet till finish current supply)  Decrease Metoprolol tart to 25mg  twice a day (may take 1/2 tab of the 50mg  tablet till finish current supply)  New prescriptions sent to pharmacy on above changes Continue all other medications.   Lab for BMET - can do middle of next week Office will contact with results via phone or letter.   Follow up in 3 weeks

## 2014-04-06 ENCOUNTER — Ambulatory Visit (INDEPENDENT_AMBULATORY_CARE_PROVIDER_SITE_OTHER): Payer: Medicare Other | Admitting: *Deleted

## 2014-04-06 DIAGNOSIS — I6350 Cerebral infarction due to unspecified occlusion or stenosis of unspecified cerebral artery: Secondary | ICD-10-CM

## 2014-04-06 DIAGNOSIS — I4891 Unspecified atrial fibrillation: Secondary | ICD-10-CM

## 2014-04-06 DIAGNOSIS — Z5181 Encounter for therapeutic drug level monitoring: Secondary | ICD-10-CM

## 2014-04-06 DIAGNOSIS — Z7901 Long term (current) use of anticoagulants: Secondary | ICD-10-CM

## 2014-04-06 LAB — POCT INR: INR: 2.7

## 2014-04-14 ENCOUNTER — Telehealth: Payer: Self-pay | Admitting: *Deleted

## 2014-04-14 DIAGNOSIS — I10 Essential (primary) hypertension: Secondary | ICD-10-CM

## 2014-04-14 DIAGNOSIS — R7989 Other specified abnormal findings of blood chemistry: Secondary | ICD-10-CM

## 2014-04-14 MED ORDER — FUROSEMIDE 40 MG PO TABS: ORAL_TABLET | ORAL | Status: DC

## 2014-04-14 NOTE — Telephone Encounter (Signed)
Message copied by Laurine Blazer on Wed Apr 14, 2014 10:55 AM ------      Message from: Kate Sable A      Created: Mon Apr 12, 2014 10:36 AM       Alternate 40 mg with 20 mg Lasix on alternate days, then repeat BMET in 1 week. ------

## 2014-04-14 NOTE — Telephone Encounter (Signed)
Notes Recorded by Laurine Blazer, LPN on 0/16/0109 at 32:35 AM Patient notified. He will do lab on 04/20/14 at [facility]. Will fax lab order over now. Already has follow up scheduled 04/21/2014 with Dr. Bronson Ing.

## 2014-04-19 ENCOUNTER — Ambulatory Visit: Payer: Medicare Other | Admitting: Cardiovascular Disease

## 2014-04-21 ENCOUNTER — Encounter: Payer: Self-pay | Admitting: Cardiovascular Disease

## 2014-04-21 ENCOUNTER — Ambulatory Visit (INDEPENDENT_AMBULATORY_CARE_PROVIDER_SITE_OTHER): Payer: Medicare Other | Admitting: Cardiovascular Disease

## 2014-04-21 VITALS — BP 155/66 | HR 88 | Ht 60.0 in | Wt 177.0 lb

## 2014-04-21 DIAGNOSIS — I480 Paroxysmal atrial fibrillation: Secondary | ICD-10-CM

## 2014-04-21 DIAGNOSIS — I5033 Acute on chronic diastolic (congestive) heart failure: Secondary | ICD-10-CM

## 2014-04-21 DIAGNOSIS — I509 Heart failure, unspecified: Secondary | ICD-10-CM

## 2014-04-21 DIAGNOSIS — I6350 Cerebral infarction due to unspecified occlusion or stenosis of unspecified cerebral artery: Secondary | ICD-10-CM

## 2014-04-21 DIAGNOSIS — I4891 Unspecified atrial fibrillation: Secondary | ICD-10-CM

## 2014-04-21 DIAGNOSIS — R799 Abnormal finding of blood chemistry, unspecified: Secondary | ICD-10-CM

## 2014-04-21 DIAGNOSIS — Z79899 Other long term (current) drug therapy: Secondary | ICD-10-CM

## 2014-04-21 DIAGNOSIS — I10 Essential (primary) hypertension: Secondary | ICD-10-CM | POA: Diagnosis not present

## 2014-04-21 DIAGNOSIS — R7989 Other specified abnormal findings of blood chemistry: Secondary | ICD-10-CM

## 2014-04-21 MED ORDER — LISINOPRIL 10 MG PO TABS: 15.0000 mg | ORAL_TABLET | Freq: Every day | ORAL | Status: DC

## 2014-04-21 MED ORDER — FUROSEMIDE 40 MG PO TABS: ORAL_TABLET | ORAL | Status: DC

## 2014-04-21 NOTE — Patient Instructions (Signed)
   Change Lasix to 40mg  on Saturday & Sunday and 20mg  on Monday thru Friday  Increase Lisinopril to 15mg  daily  Lab for BMET - due in 3 weeks Office will contact with results via phone or letter.   Your physician wants you to follow up in: 6 months.  You will receive a reminder letter in the mail one-two months in advance.  If you don't receive a letter, please call our office to schedule the follow up appointment

## 2014-04-21 NOTE — Progress Notes (Signed)
Patient ID: Karina Salazar, female   DOB: [birth year], 80 y.o.   MRN: [id number]      SUBJECTIVE: The patient is an 80 year old woman who is here for followup of atrial fibrillation, hypertension, and chronic diastolic heart failure. She also has a history of carotid artery disease which is followed by vascular surgery. She has chronic lower extremity swelling. At her last visit, I increased her Lasix dose. Due to renal insufficiency, I had her take alternating doses of 40 mg and 20 mg of Lasix. Due to her most recent basic metabolic panel, I've instructed her to take Lasix 40 mg on Saturdays and Sundays and 20 mg all other days a week.  Wt down to 177 lbs from 186 lbs at last visit. BUN 37, creatinine 1.68 (marked increase from 1 year ago). She still has some generalized weakness, but her son tells me she is up and about at the house.  Allergies  Allergen Reactions  . Ciprofloxacin   . Other     FLUOROQUINOLONE    Current Outpatient Prescriptions  Medication Sig Dispense Refill  . cholecalciferol (VITAMIN D) 1000 UNITS tablet Take 1,000 Units by mouth daily.      . furosemide (LASIX) 40 MG tablet Take 40mg  every other day, alternating with 20mg  every other day      . lisinopril (PRINIVIL,ZESTRIL) 10 MG tablet Take 1 tablet (10 mg total) by mouth daily.  30 tablet  6  . metoprolol (LOPRESSOR) 25 MG tablet Take 1 tablet (25 mg total) by mouth 2 (two) times daily.  60 tablet  6  . Multiple Vitamin (MULTIVITAMIN) tablet Take 1 tablet by mouth daily.        Marland Kitchen warfarin (COUMADIN) 2 MG tablet Take 2 tablets everyday except on Wednesday take 3 tablets.  64 tablet  3   No current facility-administered medications for this visit.    Past Medical History  Diagnosis Date  . Breast cancer, left breast     s/p left mastectomy  . Leg wound, left   . Blindness of right eye     sustained trauma to right eye  . Dizziness     occasional  . Hypertension   . Carotid artery occlusion 03/2011   occluded left ICA. Near occlusive disease in R ICA. s/p CEA  . Stroke 03/2011  . Pulmonary hypertension     moderate  . Atrial fibrillation     normal EF  . COPD (chronic obstructive pulmonary disease)   . Chronic diastolic congestive heart failure     Past Surgical History  Procedure Laterality Date  . Left breast mastectomy      breast cancer  . Resection of bowel      many years ago,small bowel obstruction  . Abdominal hysterectomy    . Appendectomy    . Left eye cataract surgery    . Carotid endarterectomy  03/2011    right    History   Social History  . Marital Status: Divorced    Spouse Name: N/A    Number of Children: N/A  . Years of Education: N/A   Occupational History  . Not on file.   Social History Main Topics  . Smoking status: Former Smoker -- 0.50 packs/day for 35 years    Types: Cigarettes    Quit date: 10/22/1976  . Smokeless tobacco: Never Used  . Alcohol Use: No  . Drug Use: No  . Sexual Activity: Not on file   Other Topics Concern  .  Not on file   Social History Narrative   Lives alone and independent in ADL's.   Ambulates with help of cane     Filed Vitals:   04/21/14 1428  BP: 155/66  Pulse: 88  Height: 5' (1.524 m)  Weight: 177 lb (80.287 kg)    PHYSICAL EXAM General: NAD Neck: No JVD, no thyromegaly. Lungs: Clear to auscultation bilaterally with normal respiratory effort. CV: Nondisplaced PMI.  Regular rate and rhythm, normal S1/S2, no S3/S4, no murmur. 1+pretibial and periankle edema.  Abdomen: Soft, nontender, no hepatosplenomegaly, no distention.  Neurologic: Alert and oriented x 3.  Psych: Normal affect. Extremities: No clubbing or cyanosis.   ECG: reviewed and available in electronic records.    ASSESSMENT AND PLAN: 1. Atrial fibrillation: Continue metoprolol 25 mg bid. Continue warfarin.  2. HTN: Blood pressure is elevated. I will increase lisinopril to 15 mg daily.  3. Acute on chronic diastolic heart failure:  Reduced leg swelling and weight since last visit. Due to her most recent basic metabolic panel, I have instructed her to take Lasix 40 mg on Saturdays and Sundays and 20 mg all other days a week. Repeat BMET in 3 weeks.     Will aim to control BP by increasing lisinopril to 15 mg daily.  4. Carotid artery disease: Followed by [facility].  5. Peaked T waves: No evidence of hyperkalemia.   Dispo: f/u 6 months.   Kate Sable, M.D., F.A.C.C.

## 2014-05-10 ENCOUNTER — Telehealth: Payer: Self-pay | Admitting: Cardiovascular Disease

## 2014-05-10 NOTE — Telephone Encounter (Signed)
Lasix &  Lisinopril dosage was changed . Patient needs to have RX dosage increased and called into [facility].

## 2014-05-11 MED ORDER — LISINOPRIL 10 MG PO TABS: 15.0000 mg | ORAL_TABLET | Freq: Every day | ORAL | Status: DC

## 2014-05-11 MED ORDER — FUROSEMIDE 40 MG PO TABS: ORAL_TABLET | ORAL | Status: DC

## 2014-05-11 NOTE — Telephone Encounter (Signed)
Son Charlotte Crumb Trine) notified of med sent to [facility].

## 2014-05-18 ENCOUNTER — Ambulatory Visit (INDEPENDENT_AMBULATORY_CARE_PROVIDER_SITE_OTHER): Payer: Medicare Other | Admitting: *Deleted

## 2014-05-18 DIAGNOSIS — I6350 Cerebral infarction due to unspecified occlusion or stenosis of unspecified cerebral artery: Secondary | ICD-10-CM

## 2014-05-18 DIAGNOSIS — I4891 Unspecified atrial fibrillation: Secondary | ICD-10-CM

## 2014-05-18 DIAGNOSIS — Z5181 Encounter for therapeutic drug level monitoring: Secondary | ICD-10-CM

## 2014-05-18 DIAGNOSIS — Z7901 Long term (current) use of anticoagulants: Secondary | ICD-10-CM

## 2014-05-18 LAB — POCT INR: INR: 3.4

## 2014-05-20 ENCOUNTER — Telehealth: Payer: Self-pay | Admitting: *Deleted

## 2014-05-20 DIAGNOSIS — I10 Essential (primary) hypertension: Secondary | ICD-10-CM

## 2014-05-20 DIAGNOSIS — R7989 Other specified abnormal findings of blood chemistry: Secondary | ICD-10-CM

## 2014-05-20 MED ORDER — FUROSEMIDE 40 MG PO TABS: ORAL_TABLET | ORAL | Status: DC

## 2014-05-20 NOTE — Telephone Encounter (Signed)
Notes Recorded by Laurine Blazer, LPN on 7/71/1657 at 90:38 AM Son Karina Salazar) notified. Will fax lab order to [facility]. He will do around 05/31/14.

## 2014-05-20 NOTE — Telephone Encounter (Signed)
Message copied by Laurine Blazer on Thu May 20, 2014 10:59 AM ------      Message from: Kate Sable A      Created: Mon May 17, 2014  9:53 AM       Alternate 40 mg and 20 mg on alternate days. Then repeat BMET in 10 days. ------

## 2014-06-07 ENCOUNTER — Telehealth: Payer: Self-pay | Admitting: *Deleted

## 2014-06-07 MED ORDER — FUROSEMIDE 20 MG PO TABS: 20.0000 mg | ORAL_TABLET | Freq: Every day | ORAL | Status: DC

## 2014-06-07 NOTE — Telephone Encounter (Signed)
Message copied by Merlene Laughter on Mon Jun 07, 2014 11:21 AM ------      Message from: Merlene Laughter      Created: Mon Jun 07, 2014  6:58 AM                   ----- Message -----         From: Herminio Commons, MD         Sent: 06/04/2014   5:30 PM           To: Laurine Blazer, LPN            Reduce Lasix to 20 mg daily and only take extra 20 mg as needed for leg swelling/sob. ------

## 2014-06-07 NOTE — Telephone Encounter (Signed)
Son informed and verbalized understanding of plan.

## 2014-06-15 ENCOUNTER — Other Ambulatory Visit: Payer: Self-pay | Admitting: Cardiology

## 2014-06-15 ENCOUNTER — Ambulatory Visit (INDEPENDENT_AMBULATORY_CARE_PROVIDER_SITE_OTHER): Payer: Medicare Other | Admitting: *Deleted

## 2014-06-15 DIAGNOSIS — I6350 Cerebral infarction due to unspecified occlusion or stenosis of unspecified cerebral artery: Secondary | ICD-10-CM

## 2014-06-15 DIAGNOSIS — I4891 Unspecified atrial fibrillation: Secondary | ICD-10-CM

## 2014-06-15 DIAGNOSIS — Z7901 Long term (current) use of anticoagulants: Secondary | ICD-10-CM

## 2014-06-15 DIAGNOSIS — Z5181 Encounter for therapeutic drug level monitoring: Secondary | ICD-10-CM

## 2014-06-15 LAB — POCT INR: INR: 2.4

## 2014-07-13 ENCOUNTER — Ambulatory Visit (INDEPENDENT_AMBULATORY_CARE_PROVIDER_SITE_OTHER): Payer: Medicare Other | Admitting: *Deleted

## 2014-07-13 DIAGNOSIS — Z5181 Encounter for therapeutic drug level monitoring: Secondary | ICD-10-CM

## 2014-07-13 DIAGNOSIS — I4891 Unspecified atrial fibrillation: Secondary | ICD-10-CM

## 2014-07-13 DIAGNOSIS — I6350 Cerebral infarction due to unspecified occlusion or stenosis of unspecified cerebral artery: Secondary | ICD-10-CM

## 2014-07-13 LAB — POCT INR: INR: 2.3

## 2014-07-22 ENCOUNTER — Ambulatory Visit (INDEPENDENT_AMBULATORY_CARE_PROVIDER_SITE_OTHER): Payer: Medicare Other | Admitting: *Deleted

## 2014-07-22 DIAGNOSIS — I4891 Unspecified atrial fibrillation: Secondary | ICD-10-CM

## 2014-07-22 DIAGNOSIS — Z5181 Encounter for therapeutic drug level monitoring: Secondary | ICD-10-CM

## 2014-07-22 LAB — POCT INR: INR: 1.7

## 2014-07-30 ENCOUNTER — Ambulatory Visit (INDEPENDENT_AMBULATORY_CARE_PROVIDER_SITE_OTHER): Payer: Medicare Other | Admitting: Pharmacist

## 2014-07-30 ENCOUNTER — Telehealth: Payer: Self-pay | Admitting: *Deleted

## 2014-07-30 DIAGNOSIS — I4891 Unspecified atrial fibrillation: Secondary | ICD-10-CM

## 2014-07-30 DIAGNOSIS — Z5181 Encounter for therapeutic drug level monitoring: Secondary | ICD-10-CM

## 2014-07-30 LAB — POCT INR: INR: 2.3

## 2014-07-30 NOTE — Telephone Encounter (Signed)
inr 2.3

## 2014-07-30 NOTE — Telephone Encounter (Signed)
INR addressed.  See anti-coag note for details.  

## 2014-08-13 ENCOUNTER — Telehealth: Payer: Self-pay | Admitting: Cardiovascular Disease

## 2014-08-13 ENCOUNTER — Ambulatory Visit (INDEPENDENT_AMBULATORY_CARE_PROVIDER_SITE_OTHER): Payer: Self-pay | Admitting: Interventional Cardiology

## 2014-08-13 DIAGNOSIS — Z5181 Encounter for therapeutic drug level monitoring: Secondary | ICD-10-CM

## 2014-08-13 DIAGNOSIS — I4891 Unspecified atrial fibrillation: Secondary | ICD-10-CM

## 2014-08-13 LAB — POCT INR: INR: 3.4

## 2014-08-13 NOTE — Telephone Encounter (Signed)
Result noted called pt's son with dosing instructions, and called [facility] RN.  See anticoagulation note in Epic.

## 2014-08-13 NOTE — Telephone Encounter (Signed)
New problem   Reporting pt's INR result :3.4.Marland KitchenNeed call back please

## 2014-08-24 ENCOUNTER — Telehealth: Payer: Self-pay | Admitting: *Deleted

## 2014-08-24 ENCOUNTER — Ambulatory Visit (INDEPENDENT_AMBULATORY_CARE_PROVIDER_SITE_OTHER): Payer: Medicare Other | Admitting: *Deleted

## 2014-08-24 DIAGNOSIS — I4891 Unspecified atrial fibrillation: Secondary | ICD-10-CM

## 2014-08-24 DIAGNOSIS — Z5181 Encounter for therapeutic drug level monitoring: Secondary | ICD-10-CM

## 2014-08-24 LAB — POCT INR: INR: 2.3

## 2014-08-24 NOTE — Telephone Encounter (Signed)
See coumadin note. 

## 2014-09-01 ENCOUNTER — Ambulatory Visit (INDEPENDENT_AMBULATORY_CARE_PROVIDER_SITE_OTHER): Payer: Medicare Other | Admitting: *Deleted

## 2014-09-01 DIAGNOSIS — I4891 Unspecified atrial fibrillation: Secondary | ICD-10-CM

## 2014-09-01 DIAGNOSIS — Z5181 Encounter for therapeutic drug level monitoring: Secondary | ICD-10-CM

## 2014-09-01 LAB — POCT INR: INR: 2.8

## 2014-09-27 ENCOUNTER — Ambulatory Visit (INDEPENDENT_AMBULATORY_CARE_PROVIDER_SITE_OTHER): Payer: Medicare Other | Admitting: Cardiovascular Disease

## 2014-09-27 ENCOUNTER — Encounter: Payer: Self-pay | Admitting: Cardiovascular Disease

## 2014-09-27 VITALS — BP 137/76 | HR 59 | Ht 60.0 in | Wt 150.0 lb

## 2014-09-27 DIAGNOSIS — I5032 Chronic diastolic (congestive) heart failure: Secondary | ICD-10-CM | POA: Diagnosis not present

## 2014-09-27 DIAGNOSIS — I779 Disorder of arteries and arterioles, unspecified: Secondary | ICD-10-CM | POA: Diagnosis not present

## 2014-09-27 DIAGNOSIS — I4891 Unspecified atrial fibrillation: Secondary | ICD-10-CM

## 2014-09-27 DIAGNOSIS — I639 Cerebral infarction, unspecified: Secondary | ICD-10-CM

## 2014-09-27 DIAGNOSIS — I739 Peripheral vascular disease, unspecified: Secondary | ICD-10-CM

## 2014-09-27 DIAGNOSIS — I10 Essential (primary) hypertension: Secondary | ICD-10-CM | POA: Diagnosis not present

## 2014-09-27 NOTE — Patient Instructions (Signed)
Continue all current medications. Your physician wants you to follow up in:  1 year.  You will receive a reminder letter in the mail one-two months in advance.  If you don't receive a letter, please call our office to schedule the follow up appointment   

## 2014-09-27 NOTE — Progress Notes (Signed)
Patient ID: Karina Salazar, female   DOB: 1923-11-14, 80 y.o.   MRN: [id number]      SUBJECTIVE: The patient is an 80 year old woman who is here for followup of atrial fibrillation, hypertension, and chronic diastolic heart failure. She also has a history of carotid artery disease which is followed by vascular surgery. She has chronic lower extremity swelling. She is doing well and denies chest pain, dizziness, syncope, and palpitations. She continues to live by herself and cooks for herself. Her son is present today.   Review of Systems: As per "subjective", otherwise negative.  Allergies  Allergen Reactions  . Ciprofloxacin   . Other     FLUOROQUINOLONE    Current Outpatient Prescriptions  Medication Sig Dispense Refill  . furosemide (LASIX) 20 MG tablet Take 1 tablet (20 mg total) by mouth daily. May take an extra 20 mg as needed for leg swelling and edema 90 tablet 3  . lisinopril (PRINIVIL,ZESTRIL) 10 MG tablet Take 1.5 tablets (15 mg total) by mouth daily. 45 tablet 6  . metoprolol (LOPRESSOR) 25 MG tablet Take 1 tablet (25 mg total) by mouth 2 (two) times daily. 60 tablet 6  . Multiple Vitamin (MULTIVITAMIN) tablet Take 1 tablet by mouth daily.      Marland Kitchen warfarin (COUMADIN) 2 MG tablet Take as directed by coumadin clinic 70 tablet 3   No current facility-administered medications for this visit.    Past Medical History  Diagnosis Date  . Breast cancer, left breast     s/p left mastectomy  . Leg wound, left   . Blindness of right eye     sustained trauma to right eye  . Dizziness     occasional  . Hypertension   . Carotid artery occlusion 03/2011    occluded left ICA. Near occlusive disease in R ICA. s/p CEA  . Stroke 03/2011  . Pulmonary hypertension     moderate  . Atrial fibrillation     normal EF  . COPD (chronic obstructive pulmonary disease)   . Chronic diastolic congestive heart failure     Past Surgical History  Procedure Laterality Date  . Left breast  mastectomy      breast cancer  . Resection of bowel      many years ago,small bowel obstruction  . Abdominal hysterectomy    . Appendectomy    . Left eye cataract surgery    . Carotid endarterectomy  03/2011    right    History   Social History  . Marital Status: Divorced    Spouse Name: N/A    Number of Children: N/A  . Years of Education: N/A   Occupational History  . Not on file.   Social History Main Topics  . Smoking status: Former Smoker -- 0.50 packs/day for 35 years    Types: Cigarettes    Start date: 03/21/1941    Quit date: 10/22/1976  . Smokeless tobacco: Never Used  . Alcohol Use: No  . Drug Use: No  . Sexual Activity: Not on file   Other Topics Concern  . Not on file   Social History Narrative   Lives alone and independent in ADL's.   Ambulates with help of cane     Filed Vitals:   09/27/14 1302  Height: 5' (1.524 m)  Weight: 150 lb (68.04 kg)   BP 137/76  Pulse 59 SpO2 96%   PHYSICAL EXAM General: NAD HEENT: Normal. Neck: No JVD, no thyromegaly. Lungs: Clear to auscultation bilaterally  with normal respiratory effort. CV: Nondisplaced PMI.  Irregular rhythm, normal S1/S2, no S3, no murmur. Trace left pretibial edema.  Abdomen: Soft, nontender, no hepatosplenomegaly, no distention.  Neurologic: Alert and oriented x 3.  Psych: Normal affect. Skin: Normal. Musculoskeletal: Normal range of motion, no gross deformities. Extremities: No clubbing or cyanosis. Bilateral venous varicosities.  ECG: Most recent ECG reviewed.      ASSESSMENT AND PLAN: 1. Atrial fibrillation: Stable. Continue metoprolol 25 mg bid. Continue warfarin.  2. Essential HTN: Blood pressure is normal. Continue lisinopril 15 mg daily.  3. Chronic diastolic heart failure: Euvolemic and stable. No changes to therapy. 4. Carotid artery disease: Followed by [facility].   Dispo: f/u 1 year.  Kate Sable, M.D., F.A.C.C.

## 2014-09-28 ENCOUNTER — Ambulatory Visit (INDEPENDENT_AMBULATORY_CARE_PROVIDER_SITE_OTHER): Payer: Medicare Other | Admitting: *Deleted

## 2014-09-28 DIAGNOSIS — I639 Cerebral infarction, unspecified: Secondary | ICD-10-CM

## 2014-09-28 DIAGNOSIS — Z5181 Encounter for therapeutic drug level monitoring: Secondary | ICD-10-CM

## 2014-09-28 DIAGNOSIS — Z7901 Long term (current) use of anticoagulants: Secondary | ICD-10-CM

## 2014-09-28 DIAGNOSIS — I6350 Cerebral infarction due to unspecified occlusion or stenosis of unspecified cerebral artery: Secondary | ICD-10-CM

## 2014-09-28 DIAGNOSIS — I4891 Unspecified atrial fibrillation: Secondary | ICD-10-CM

## 2014-09-28 LAB — POCT INR: INR: 4.8

## 2014-10-12 ENCOUNTER — Ambulatory Visit (INDEPENDENT_AMBULATORY_CARE_PROVIDER_SITE_OTHER): Payer: Medicare Other | Admitting: *Deleted

## 2014-10-12 DIAGNOSIS — I4891 Unspecified atrial fibrillation: Secondary | ICD-10-CM

## 2014-10-12 DIAGNOSIS — Z5181 Encounter for therapeutic drug level monitoring: Secondary | ICD-10-CM

## 2014-10-12 DIAGNOSIS — Z7901 Long term (current) use of anticoagulants: Secondary | ICD-10-CM

## 2014-10-12 DIAGNOSIS — I639 Cerebral infarction, unspecified: Secondary | ICD-10-CM

## 2014-10-12 DIAGNOSIS — I6350 Cerebral infarction due to unspecified occlusion or stenosis of unspecified cerebral artery: Secondary | ICD-10-CM

## 2014-10-12 LAB — POCT INR: INR: 3.3

## 2014-11-09 ENCOUNTER — Ambulatory Visit (INDEPENDENT_AMBULATORY_CARE_PROVIDER_SITE_OTHER): Payer: Medicare Other | Admitting: *Deleted

## 2014-11-09 DIAGNOSIS — I480 Paroxysmal atrial fibrillation: Secondary | ICD-10-CM

## 2014-11-09 DIAGNOSIS — I639 Cerebral infarction, unspecified: Secondary | ICD-10-CM

## 2014-11-09 DIAGNOSIS — I4891 Unspecified atrial fibrillation: Secondary | ICD-10-CM

## 2014-11-09 DIAGNOSIS — Z7901 Long term (current) use of anticoagulants: Secondary | ICD-10-CM

## 2014-11-09 DIAGNOSIS — Z5181 Encounter for therapeutic drug level monitoring: Secondary | ICD-10-CM

## 2014-11-09 DIAGNOSIS — I6350 Cerebral infarction due to unspecified occlusion or stenosis of unspecified cerebral artery: Secondary | ICD-10-CM

## 2014-11-09 LAB — POCT INR: INR: 2.8

## 2014-11-09 MED ORDER — WARFARIN SODIUM 2 MG PO TABS: ORAL_TABLET | ORAL | Status: AC

## 2014-12-09 ENCOUNTER — Ambulatory Visit (INDEPENDENT_AMBULATORY_CARE_PROVIDER_SITE_OTHER): Payer: Medicare Other | Admitting: *Deleted

## 2014-12-09 DIAGNOSIS — Z5181 Encounter for therapeutic drug level monitoring: Secondary | ICD-10-CM

## 2014-12-09 DIAGNOSIS — I6350 Cerebral infarction due to unspecified occlusion or stenosis of unspecified cerebral artery: Secondary | ICD-10-CM

## 2014-12-09 DIAGNOSIS — Z7901 Long term (current) use of anticoagulants: Secondary | ICD-10-CM

## 2014-12-09 DIAGNOSIS — I480 Paroxysmal atrial fibrillation: Secondary | ICD-10-CM

## 2014-12-09 DIAGNOSIS — I639 Cerebral infarction, unspecified: Secondary | ICD-10-CM

## 2014-12-09 DIAGNOSIS — I4891 Unspecified atrial fibrillation: Secondary | ICD-10-CM

## 2014-12-09 LAB — POCT INR: INR: 2.1

## 2014-12-17 ENCOUNTER — Other Ambulatory Visit: Payer: Self-pay | Admitting: Cardiovascular Disease

## 2015-01-06 ENCOUNTER — Ambulatory Visit (INDEPENDENT_AMBULATORY_CARE_PROVIDER_SITE_OTHER): Payer: Medicare Other | Admitting: *Deleted

## 2015-01-06 DIAGNOSIS — Z5181 Encounter for therapeutic drug level monitoring: Secondary | ICD-10-CM | POA: Diagnosis not present

## 2015-01-06 DIAGNOSIS — I4891 Unspecified atrial fibrillation: Secondary | ICD-10-CM

## 2015-01-06 DIAGNOSIS — I639 Cerebral infarction, unspecified: Secondary | ICD-10-CM

## 2015-01-06 DIAGNOSIS — Z7901 Long term (current) use of anticoagulants: Secondary | ICD-10-CM

## 2015-01-06 DIAGNOSIS — I6350 Cerebral infarction due to unspecified occlusion or stenosis of unspecified cerebral artery: Secondary | ICD-10-CM

## 2015-01-06 DIAGNOSIS — I480 Paroxysmal atrial fibrillation: Secondary | ICD-10-CM

## 2015-01-06 LAB — POCT INR: INR: 2.7

## 2015-02-17 ENCOUNTER — Ambulatory Visit (INDEPENDENT_AMBULATORY_CARE_PROVIDER_SITE_OTHER): Payer: Medicare Other | Admitting: *Deleted

## 2015-02-17 DIAGNOSIS — I639 Cerebral infarction, unspecified: Secondary | ICD-10-CM | POA: Diagnosis not present

## 2015-02-17 DIAGNOSIS — Z5181 Encounter for therapeutic drug level monitoring: Secondary | ICD-10-CM

## 2015-02-17 DIAGNOSIS — Z7901 Long term (current) use of anticoagulants: Secondary | ICD-10-CM

## 2015-02-17 DIAGNOSIS — I4891 Unspecified atrial fibrillation: Secondary | ICD-10-CM

## 2015-02-17 DIAGNOSIS — I6350 Cerebral infarction due to unspecified occlusion or stenosis of unspecified cerebral artery: Secondary | ICD-10-CM

## 2015-02-17 LAB — POCT INR: INR: 2.6

## 2015-02-28 ENCOUNTER — Other Ambulatory Visit: Payer: Self-pay | Admitting: Cardiovascular Disease

## 2015-03-31 ENCOUNTER — Ambulatory Visit (INDEPENDENT_AMBULATORY_CARE_PROVIDER_SITE_OTHER): Payer: Medicare Other | Admitting: *Deleted

## 2015-03-31 DIAGNOSIS — I4891 Unspecified atrial fibrillation: Secondary | ICD-10-CM | POA: Diagnosis not present

## 2015-03-31 DIAGNOSIS — Z7901 Long term (current) use of anticoagulants: Secondary | ICD-10-CM

## 2015-03-31 DIAGNOSIS — Z5181 Encounter for therapeutic drug level monitoring: Secondary | ICD-10-CM

## 2015-03-31 DIAGNOSIS — I639 Cerebral infarction, unspecified: Secondary | ICD-10-CM

## 2015-03-31 DIAGNOSIS — I6350 Cerebral infarction due to unspecified occlusion or stenosis of unspecified cerebral artery: Secondary | ICD-10-CM

## 2015-03-31 LAB — POCT INR: INR: 2.6

## 2015-04-08 ENCOUNTER — Other Ambulatory Visit: Payer: Self-pay | Admitting: Cardiovascular Disease

## 2015-05-12 ENCOUNTER — Ambulatory Visit (INDEPENDENT_AMBULATORY_CARE_PROVIDER_SITE_OTHER): Payer: Medicare Other | Admitting: *Deleted

## 2015-05-12 DIAGNOSIS — Z7901 Long term (current) use of anticoagulants: Secondary | ICD-10-CM | POA: Diagnosis not present

## 2015-05-12 DIAGNOSIS — I639 Cerebral infarction, unspecified: Secondary | ICD-10-CM | POA: Diagnosis not present

## 2015-05-12 DIAGNOSIS — I4891 Unspecified atrial fibrillation: Secondary | ICD-10-CM | POA: Diagnosis not present

## 2015-05-12 DIAGNOSIS — Z5181 Encounter for therapeutic drug level monitoring: Secondary | ICD-10-CM

## 2015-05-12 DIAGNOSIS — I6350 Cerebral infarction due to unspecified occlusion or stenosis of unspecified cerebral artery: Secondary | ICD-10-CM

## 2015-05-12 LAB — POCT INR: INR: 4.5

## 2015-05-26 ENCOUNTER — Ambulatory Visit (INDEPENDENT_AMBULATORY_CARE_PROVIDER_SITE_OTHER): Payer: Medicare Other | Admitting: *Deleted

## 2015-05-26 DIAGNOSIS — I639 Cerebral infarction, unspecified: Secondary | ICD-10-CM

## 2015-05-26 DIAGNOSIS — Z7901 Long term (current) use of anticoagulants: Secondary | ICD-10-CM | POA: Diagnosis not present

## 2015-05-26 DIAGNOSIS — Z5181 Encounter for therapeutic drug level monitoring: Secondary | ICD-10-CM | POA: Diagnosis not present

## 2015-05-26 DIAGNOSIS — I4891 Unspecified atrial fibrillation: Secondary | ICD-10-CM | POA: Diagnosis not present

## 2015-05-26 DIAGNOSIS — I6350 Cerebral infarction due to unspecified occlusion or stenosis of unspecified cerebral artery: Secondary | ICD-10-CM

## 2015-05-26 LAB — POCT INR: INR: 3.4

## 2015-05-31 ENCOUNTER — Other Ambulatory Visit: Payer: Self-pay | Admitting: Cardiovascular Disease

## 2015-06-09 ENCOUNTER — Ambulatory Visit (INDEPENDENT_AMBULATORY_CARE_PROVIDER_SITE_OTHER): Payer: Medicare Other | Admitting: *Deleted

## 2015-06-09 DIAGNOSIS — I4891 Unspecified atrial fibrillation: Secondary | ICD-10-CM

## 2015-06-09 DIAGNOSIS — Z5181 Encounter for therapeutic drug level monitoring: Secondary | ICD-10-CM

## 2015-06-09 LAB — POCT INR: INR: 4.2

## 2015-06-21 ENCOUNTER — Ambulatory Visit (INDEPENDENT_AMBULATORY_CARE_PROVIDER_SITE_OTHER): Payer: Medicare Other | Admitting: *Deleted

## 2015-06-21 DIAGNOSIS — I4891 Unspecified atrial fibrillation: Secondary | ICD-10-CM

## 2015-06-21 DIAGNOSIS — Z7901 Long term (current) use of anticoagulants: Secondary | ICD-10-CM | POA: Diagnosis not present

## 2015-06-21 DIAGNOSIS — I639 Cerebral infarction, unspecified: Secondary | ICD-10-CM | POA: Diagnosis not present

## 2015-06-21 DIAGNOSIS — I6350 Cerebral infarction due to unspecified occlusion or stenosis of unspecified cerebral artery: Secondary | ICD-10-CM

## 2015-06-21 DIAGNOSIS — Z5181 Encounter for therapeutic drug level monitoring: Secondary | ICD-10-CM

## 2015-06-21 LAB — POCT INR: INR: 2.9

## 2015-07-05 ENCOUNTER — Ambulatory Visit (INDEPENDENT_AMBULATORY_CARE_PROVIDER_SITE_OTHER): Payer: Medicare Other | Admitting: *Deleted

## 2015-07-05 DIAGNOSIS — I639 Cerebral infarction, unspecified: Secondary | ICD-10-CM | POA: Diagnosis not present

## 2015-07-05 DIAGNOSIS — Z7901 Long term (current) use of anticoagulants: Secondary | ICD-10-CM | POA: Diagnosis not present

## 2015-07-05 DIAGNOSIS — Z5181 Encounter for therapeutic drug level monitoring: Secondary | ICD-10-CM

## 2015-07-05 DIAGNOSIS — I4891 Unspecified atrial fibrillation: Secondary | ICD-10-CM | POA: Diagnosis not present

## 2015-07-05 DIAGNOSIS — I6350 Cerebral infarction due to unspecified occlusion or stenosis of unspecified cerebral artery: Secondary | ICD-10-CM

## 2015-07-05 LAB — POCT INR: INR: 2.8

## 2015-08-04 ENCOUNTER — Ambulatory Visit (INDEPENDENT_AMBULATORY_CARE_PROVIDER_SITE_OTHER): Payer: Medicare Other | Admitting: *Deleted

## 2015-08-04 DIAGNOSIS — Z7901 Long term (current) use of anticoagulants: Secondary | ICD-10-CM | POA: Diagnosis not present

## 2015-08-04 DIAGNOSIS — Z5181 Encounter for therapeutic drug level monitoring: Secondary | ICD-10-CM | POA: Diagnosis not present

## 2015-08-04 DIAGNOSIS — I4891 Unspecified atrial fibrillation: Secondary | ICD-10-CM

## 2015-08-04 DIAGNOSIS — I6350 Cerebral infarction due to unspecified occlusion or stenosis of unspecified cerebral artery: Secondary | ICD-10-CM | POA: Diagnosis not present

## 2015-08-04 LAB — POCT INR: INR: 2

## 2015-08-23 ENCOUNTER — Other Ambulatory Visit: Payer: Self-pay | Admitting: Cardiovascular Disease

## 2015-08-26 ENCOUNTER — Telehealth: Payer: Self-pay | Admitting: Cardiovascular Disease

## 2015-08-26 NOTE — Telephone Encounter (Signed)
Son was told to let Dr Bronson Ing know she was in [facility]

## 2015-08-26 NOTE — Telephone Encounter (Signed)
Pt currently admitted for UTI and other symptoms that son says may be stroke. Pt son will update [place] when pt is d/c so that we may request those records

## 2015-10-06 ENCOUNTER — Ambulatory Visit (INDEPENDENT_AMBULATORY_CARE_PROVIDER_SITE_OTHER): Payer: Medicare Other | Admitting: Cardiovascular Disease

## 2015-10-06 ENCOUNTER — Encounter: Payer: Self-pay | Admitting: Cardiovascular Disease

## 2015-10-06 VITALS — BP 118/44 | HR 54 | Ht 64.0 in | Wt 142.0 lb

## 2015-10-06 DIAGNOSIS — I4891 Unspecified atrial fibrillation: Secondary | ICD-10-CM | POA: Diagnosis not present

## 2015-10-06 DIAGNOSIS — I739 Peripheral vascular disease, unspecified: Secondary | ICD-10-CM

## 2015-10-06 DIAGNOSIS — I779 Disorder of arteries and arterioles, unspecified: Secondary | ICD-10-CM

## 2015-10-06 DIAGNOSIS — I6350 Cerebral infarction due to unspecified occlusion or stenosis of unspecified cerebral artery: Secondary | ICD-10-CM

## 2015-10-06 DIAGNOSIS — I2510 Atherosclerotic heart disease of native coronary artery without angina pectoris: Secondary | ICD-10-CM | POA: Diagnosis not present

## 2015-10-06 DIAGNOSIS — I5032 Chronic diastolic (congestive) heart failure: Secondary | ICD-10-CM | POA: Diagnosis not present

## 2015-10-06 DIAGNOSIS — R9431 Abnormal electrocardiogram [ECG] [EKG]: Secondary | ICD-10-CM | POA: Diagnosis not present

## 2015-10-06 DIAGNOSIS — I10 Essential (primary) hypertension: Secondary | ICD-10-CM

## 2015-10-06 NOTE — Patient Instructions (Signed)
Continue all current medications. Your physician wants you to follow up in:  1 year.  You will receive a reminder letter in the mail one-two months in advance.  If you don't receive a letter, please call our office to schedule the follow up appointment   

## 2015-10-06 NOTE — Progress Notes (Signed)
Patient ID: Karina Salazar, female   DOB: 1924-10-13, 81 y.o.   MRN: [id number]      SUBJECTIVE: The patient is an 81 year old woman who is here for followup of atrial fibrillation, hypertension, and chronic diastolic heart failure. She also has a history of carotid artery disease which is followed by vascular surgery. She has chronic lower extremity swelling.   She denies chest pain, dizziness, syncope, and palpitations.   ECG today shows atrial fibrillation, heart rate 54 bpm, with diffuse T-wave inversions suspicious for ischemia in inferolateral and anterior leads.  She is a DNR code status.  Review of Systems: As per "subjective", otherwise negative.  Allergies  Allergen Reactions  . Ciprofloxacin   . Other     FLUOROQUINOLONE    Current Outpatient Prescriptions  Medication Sig Dispense Refill  . atorvastatin (LIPITOR) 40 MG tablet Take 40 mg by mouth daily.    . furosemide (LASIX) 20 MG tablet TAKE ONE TABLET BY MOUTH DAILY. MAY TAKE A EXTRA 20 MG AS NEEDED FOR LEG SWELLING AND EDEMA 90 tablet 3  . lisinopril (PRINIVIL,ZESTRIL) 10 MG tablet TAKE 1 AND 1/2 TABLETS BY MOUTH DAILY 45 tablet 6  . metoprolol tartrate (LOPRESSOR) 25 MG tablet TAKE ONE TABLET BY MOUTH TWICE DAILY. 60 tablet 6  . Multiple Vitamin (MULTIVITAMIN) tablet Take 1 tablet by mouth daily.      . potassium chloride SA (K-DUR,KLOR-CON) 20 MEQ tablet Take 20 mEq by mouth daily.    Marland Kitchen warfarin (COUMADIN) 2 MG tablet Take 2 tablets daily except 1 tablet on Tuesdays or as directed 70 tablet 3  . warfarin (COUMADIN) 2 MG tablet Take 1 tablet daily except 2 tablets on Sundays, Tuesdays and Thursdays 70 tablet 3   No current facility-administered medications for this visit.    Past Medical History  Diagnosis Date  . Breast cancer, left breast (HCC)     s/p left mastectomy  . Leg wound, left   . Blindness of right eye     sustained trauma to right eye  . Dizziness     occasional  . Hypertension   . Carotid  artery occlusion 03/2011    occluded left ICA. Near occlusive disease in R ICA. s/p CEA  . Stroke ([place]) 03/2011  . Pulmonary hypertension (HCC)     moderate  . Atrial fibrillation (HCC)     normal EF  . COPD (chronic obstructive pulmonary disease) ([place])   . Chronic diastolic congestive heart failure [place])     Past Surgical History  Procedure Laterality Date  . Left breast mastectomy      breast cancer  . Resection of bowel      many years ago,small bowel obstruction  . Abdominal hysterectomy    . Appendectomy    . Left eye cataract surgery    . Carotid endarterectomy  03/2011    right    Social History   Social History  . Marital Status: Widowed    Spouse Name: N/A  . Number of Children: N/A  . Years of Education: N/A   Occupational History  . Not on file.   Social History Main Topics  . Smoking status: Former Smoker -- 0.50 packs/day for 35 years    Types: Cigarettes    Start date: 03/21/1941    Quit date: 10/22/1976  . Smokeless tobacco: Never Used  . Alcohol Use: No  . Drug Use: No  . Sexual Activity: Not on file   Other Topics Concern  . Not  on file   Social History Narrative   Lives alone and independent in ADL's.   Ambulates with help of cane     Filed Vitals:   10/06/15 1516  BP: 118/44  Pulse: 54  Height: 5\' 4"  (1.626 m)  Weight: 142 lb (64.411 kg)    PHYSICAL EXAM General: NAD, elderly, frail HEENT: Normal. Neck: No JVD. Lungs: Clear to auscultation bilaterally with normal respiratory effort. CV: Nondisplaced PMI. Irregular rhythm, normal S1/S2, no S3, no murmur. Legs bandaged b/l with trace edema. Abdomen: Soft, nontender. Neurologic: Alert.     ECG: Most recent ECG reviewed.      ASSESSMENT AND PLAN: 1. Permanent trial fibrillation: Stable. Continue metoprolol 25 mg bid. Continue warfarin.   2. Essential HTN: Blood pressure is normal. Continue lisinopril.  3. Chronic diastolic heart failure: Euvolemic and stable. No  changes to therapy.  4. Carotid artery disease: Followed by [facility].   5. Abnormal ECG: Suspicious for diffuse ischemia. I confirmed she is a DNR code status. She is asymptomatic. I will continue beta blocker therapy. No antiplatelet therapy as she is on warfarin.  Dispo: f/u 1 year.   Kate Sable, M.D., F.A.C.C.

## 2015-10-23 DEATH — deceased
